# Patient Record
Sex: Male | Born: 1980 | Race: Black or African American | Hispanic: No | Marital: Single | Smoking: Former smoker
Health system: Southern US, Community
[De-identification: ages and names within clinical notes are randomized; demographics above are authoritative.]

## PROBLEM LIST (undated history)

## (undated) DIAGNOSIS — I739 Peripheral vascular disease, unspecified: Secondary | ICD-10-CM

## (undated) DIAGNOSIS — W3400XA Accidental discharge from unspecified firearms or gun, initial encounter: Secondary | ICD-10-CM

## (undated) DIAGNOSIS — I83813 Varicose veins of bilateral lower extremities with pain: Secondary | ICD-10-CM

## (undated) DIAGNOSIS — Y249XXA Unspecified firearm discharge, undetermined intent, initial encounter: Secondary | ICD-10-CM

## (undated) HISTORY — PX: VASCULAR SURGERY: SHX849

## (undated) HISTORY — DX: Varicose veins of bilateral lower extremities with pain: I83.813

## (undated) HISTORY — DX: Peripheral vascular disease, unspecified: I73.9

## (undated) HISTORY — PX: CARDIOVASCULAR SURGERY: SHX460

---
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
## 1999-04-06 ENCOUNTER — Inpatient Hospital Stay (HOSPITAL_COMMUNITY): Admission: EM | Admit: 1999-04-06 | Discharge: 1999-04-09 | Payer: Self-pay

## 1999-10-08 ENCOUNTER — Emergency Department (HOSPITAL_COMMUNITY): Admission: EM | Admit: 1999-10-08 | Discharge: 1999-10-08 | Payer: Self-pay | Admitting: Emergency Medicine

## 2000-02-22 ENCOUNTER — Emergency Department (HOSPITAL_COMMUNITY): Admission: EM | Admit: 2000-02-22 | Discharge: 2000-02-22 | Payer: Self-pay | Admitting: Emergency Medicine

## 2000-02-23 ENCOUNTER — Emergency Department (HOSPITAL_COMMUNITY): Admission: EM | Admit: 2000-02-23 | Discharge: 2000-02-23 | Payer: Self-pay | Admitting: Emergency Medicine

## 2003-11-05 ENCOUNTER — Emergency Department (HOSPITAL_COMMUNITY): Admission: EM | Admit: 2003-11-05 | Discharge: 2003-11-05 | Payer: Self-pay | Admitting: Emergency Medicine

## 2006-07-24 ENCOUNTER — Emergency Department (HOSPITAL_COMMUNITY): Admission: EM | Admit: 2006-07-24 | Discharge: 2006-07-24 | Payer: Self-pay | Admitting: Emergency Medicine

## 2010-03-19 ENCOUNTER — Emergency Department (HOSPITAL_BASED_OUTPATIENT_CLINIC_OR_DEPARTMENT_OTHER)
Admission: EM | Admit: 2010-03-19 | Discharge: 2010-03-19 | Disposition: A | Discharge status: Home / Self Care | Payer: Self-pay | Attending: Emergency Medicine | Admitting: Emergency Medicine

## 2010-03-19 ENCOUNTER — Emergency Department (INDEPENDENT_AMBULATORY_CARE_PROVIDER_SITE_OTHER): Payer: Self-pay

## 2010-03-19 DIAGNOSIS — F17200 Nicotine dependence, unspecified, uncomplicated: Secondary | ICD-10-CM | POA: Insufficient documentation

## 2010-03-19 DIAGNOSIS — R197 Diarrhea, unspecified: Secondary | ICD-10-CM | POA: Insufficient documentation

## 2010-03-19 DIAGNOSIS — R112 Nausea with vomiting, unspecified: Secondary | ICD-10-CM | POA: Insufficient documentation

## 2010-03-19 DIAGNOSIS — R109 Unspecified abdominal pain: Secondary | ICD-10-CM

## 2010-03-19 LAB — CBC
HCT: 43.6 % (ref 39.0–52.0)
Hemoglobin: 14.7 g/dL (ref 13.0–17.0)
RDW: 13.4 % (ref 11.5–15.5)
WBC: 7.9 10*3/uL (ref 4.0–10.5)

## 2010-03-19 LAB — COMPREHENSIVE METABOLIC PANEL
ALT: 57 U/L — ABNORMAL HIGH (ref 0–53)
AST: 36 U/L (ref 0–37)
Alkaline Phosphatase: 123 U/L — ABNORMAL HIGH (ref 39–117)
GFR calc Af Amer: >60 mL/min (ref >60)
Glucose, Bld: 107 mg/dL — ABNORMAL HIGH (ref 70–99)
Potassium: 4.3 mEq/L (ref 3.5–5.1)
Sodium: 146 mEq/L — ABNORMAL HIGH (ref 135–145)
Total Protein: 8 g/dL (ref 6.0–8.3)

## 2010-03-19 LAB — URINALYSIS, ROUTINE W REFLEX MICROSCOPIC
Bilirubin Urine: NEGATIVE
Glucose, UA: NEGATIVE mg/dL
Hgb urine dipstick: NEGATIVE
Ketones, ur: NEGATIVE mg/dL
Nitrite: NEGATIVE
Protein, ur: NEGATIVE mg/dL
Specific Gravity, Urine: 1.021 (ref 1.005–1.030)
Urobilinogen, UA: 0.2 mg/dL (ref 0.0–1.0)
pH: 7.5 (ref 5.0–8.0)

## 2010-03-19 LAB — DIFFERENTIAL
Basophils Absolute: 0 10*3/uL (ref 0.0–0.1)
Basophils Relative: 0 % (ref 0–1)
Eosinophils Relative: 2 % (ref 0–5)
Lymphocytes Relative: 13 % (ref 12–46)
Neutro Abs: 5.9 10*3/uL (ref 1.7–7.7)

## 2010-03-19 LAB — LIPASE, BLOOD: Lipase: 118 U/L (ref 23–300)
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
## 2010-06-21 ENCOUNTER — Emergency Department (HOSPITAL_BASED_OUTPATIENT_CLINIC_OR_DEPARTMENT_OTHER)
Admission: EM | Admit: 2010-06-21 | Discharge: 2010-06-21 | Disposition: A | Discharge status: Home / Self Care | Payer: Self-pay | Attending: Emergency Medicine | Admitting: Emergency Medicine

## 2010-06-21 DIAGNOSIS — R21 Rash and other nonspecific skin eruption: Secondary | ICD-10-CM | POA: Insufficient documentation

## 2010-10-31 LAB — DIFFERENTIAL
Lymphocytes Relative: 15
Monocytes Absolute: 0.4
Monocytes Relative: 5
Neutro Abs: 6.6
Neutrophils Relative %: 79 — ABNORMAL HIGH

## 2010-10-31 LAB — BASIC METABOLIC PANEL
Calcium: 9.8
Creatinine, Ser: 1.02
GFR calc Af Amer: >60
GFR calc non Af Amer: >60
Sodium: 140

## 2010-10-31 LAB — POCT CARDIAC MARKERS
CKMB, poc: <1 — ABNORMAL LOW
Myoglobin, poc: 54.4
Operator id: 4531

## 2010-10-31 LAB — CBC
Hemoglobin: 14.5
RBC: 4.71
WBC: 8.5

## 2010-10-31 LAB — RAPID URINE DRUG SCREEN, HOSP PERFORMED
Amphetamines: NOT DETECTED
Benzodiazepines: NOT DETECTED
Tetrahydrocannabinol: POSITIVE — AB

## 2010-12-03 ENCOUNTER — Encounter: Payer: Self-pay | Admitting: *Deleted

## 2010-12-03 ENCOUNTER — Emergency Department (HOSPITAL_COMMUNITY)
Admission: EM | Admit: 2010-12-03 | Discharge: 2010-12-03 | Disposition: A | Discharge status: Home / Self Care | Payer: Self-pay | Attending: Emergency Medicine | Admitting: Emergency Medicine

## 2010-12-03 DIAGNOSIS — J3489 Other specified disorders of nose and nasal sinuses: Secondary | ICD-10-CM | POA: Insufficient documentation

## 2010-12-03 DIAGNOSIS — F17200 Nicotine dependence, unspecified, uncomplicated: Secondary | ICD-10-CM | POA: Insufficient documentation

## 2010-12-03 DIAGNOSIS — J40 Bronchitis, not specified as acute or chronic: Secondary | ICD-10-CM | POA: Insufficient documentation

## 2010-12-03 DIAGNOSIS — R059 Cough, unspecified: Secondary | ICD-10-CM | POA: Insufficient documentation

## 2010-12-03 DIAGNOSIS — R05 Cough: Secondary | ICD-10-CM | POA: Insufficient documentation

## 2010-12-03 DIAGNOSIS — N390 Urinary tract infection, site not specified: Secondary | ICD-10-CM | POA: Insufficient documentation

## 2010-12-03 DIAGNOSIS — R6883 Chills (without fever): Secondary | ICD-10-CM | POA: Insufficient documentation

## 2010-12-03 DIAGNOSIS — Z9889 Other specified postprocedural states: Secondary | ICD-10-CM | POA: Insufficient documentation

## 2010-12-03 HISTORY — DX: Unspecified firearm discharge, undetermined intent, initial encounter: Y24.9XXA

## 2010-12-03 HISTORY — DX: Accidental discharge from unspecified firearms or gun, initial encounter: W34.00XA

## 2010-12-03 MED ORDER — AZITHROMYCIN 250 MG PO TABS: 250.0000 mg | ORAL_TABLET | Freq: Every day | ORAL | Status: AC
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
## 2010-12-03 NOTE — ED Notes (Signed)
No fever or chills, cough and cold

## 2012-05-28 ENCOUNTER — Encounter (HOSPITAL_COMMUNITY): Payer: Self-pay | Admitting: Emergency Medicine

## 2012-05-28 ENCOUNTER — Emergency Department (HOSPITAL_COMMUNITY)
Admission: EM | Admit: 2012-05-28 | Discharge: 2012-05-28 | Disposition: A | Discharge status: Home / Self Care | Payer: BC Managed Care – PPO | Attending: Emergency Medicine | Admitting: Emergency Medicine

## 2012-05-28 DIAGNOSIS — IMO0002 Reserved for concepts with insufficient information to code with codable children: Secondary | ICD-10-CM | POA: Insufficient documentation

## 2012-05-28 DIAGNOSIS — Z79899 Other long term (current) drug therapy: Secondary | ICD-10-CM | POA: Insufficient documentation

## 2012-05-28 DIAGNOSIS — Z87828 Personal history of other (healed) physical injury and trauma: Secondary | ICD-10-CM | POA: Insufficient documentation

## 2012-05-28 DIAGNOSIS — T498X5A Adverse effect of other topical agents, initial encounter: Secondary | ICD-10-CM | POA: Insufficient documentation

## 2012-05-28 DIAGNOSIS — F17200 Nicotine dependence, unspecified, uncomplicated: Secondary | ICD-10-CM | POA: Insufficient documentation

## 2012-05-28 DIAGNOSIS — T304 Corrosion of unspecified body region, unspecified degree: Secondary | ICD-10-CM

## 2012-05-28 DIAGNOSIS — N5089 Other specified disorders of the male genital organs: Secondary | ICD-10-CM | POA: Insufficient documentation

## 2012-05-28 DIAGNOSIS — B081 Molluscum contagiosum: Secondary | ICD-10-CM | POA: Insufficient documentation

## 2012-05-28 DIAGNOSIS — N509 Disorder of male genital organs, unspecified: Secondary | ICD-10-CM | POA: Insufficient documentation

## 2012-05-28 MED ORDER — BACITRACIN ZINC 500 UNIT/GM EX OINT: TOPICAL_OINTMENT | Freq: Two times a day (BID) | CUTANEOUS | Status: DC

## 2012-05-28 MED ORDER — HYDROCODONE-ACETAMINOPHEN 5-325 MG PO TABS: ORAL_TABLET | ORAL | Status: DC

## 2012-05-28 MED ORDER — CEPHALEXIN 250 MG PO CAPS: 250.0000 mg | ORAL_CAPSULE | Freq: Four times a day (QID) | ORAL | Status: DC

## 2012-05-28 NOTE — ED Notes (Signed)
Patient discharged using the teach back method he verbalizes an understanding.
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
## 2012-05-28 NOTE — ED Notes (Signed)
BIB self. Was seen by [facility] urgent care 2 weeks ago for skin rash (lower abdomen). Using prescribed cream for approx. 1 week. Now presents with red, raised rash inferior to original rash site. NO pruritis, pain. Last cream use 2 days ago. NAD

## 2012-05-29 NOTE — ED Provider Notes (Signed)
Medical screening examination/treatment/procedure(s) were performed by non-physician practitioner and as supervising physician I was immediately available for consultation/collaboration.     Travonta Gill R Elim Peale, MD 05/29/12 1540 

## 2012-11-25 ENCOUNTER — Encounter (HOSPITAL_COMMUNITY): Payer: Self-pay | Admitting: Emergency Medicine

## 2012-11-25 ENCOUNTER — Emergency Department (HOSPITAL_COMMUNITY): Payer: Self-pay

## 2012-11-25 ENCOUNTER — Emergency Department (HOSPITAL_COMMUNITY)
Admission: EM | Admit: 2012-11-25 | Discharge: 2012-11-26 | Disposition: A | Discharge status: Home / Self Care | Payer: Self-pay | Attending: Emergency Medicine | Admitting: Emergency Medicine

## 2012-11-25 DIAGNOSIS — R079 Chest pain, unspecified: Secondary | ICD-10-CM

## 2012-11-25 DIAGNOSIS — R42 Dizziness and giddiness: Secondary | ICD-10-CM | POA: Insufficient documentation

## 2012-11-25 DIAGNOSIS — R0602 Shortness of breath: Secondary | ICD-10-CM | POA: Insufficient documentation

## 2012-11-25 DIAGNOSIS — Z7982 Long term (current) use of aspirin: Secondary | ICD-10-CM | POA: Insufficient documentation

## 2012-11-25 DIAGNOSIS — R0789 Other chest pain: Secondary | ICD-10-CM | POA: Insufficient documentation

## 2012-11-25 DIAGNOSIS — F17200 Nicotine dependence, unspecified, uncomplicated: Secondary | ICD-10-CM | POA: Insufficient documentation

## 2012-11-25 LAB — BASIC METABOLIC PANEL
CO2: 27 mEq/L (ref 19–32)
Calcium: 9.4 mg/dL (ref 8.4–10.5)
Chloride: 101 mEq/L (ref 96–112)
Creatinine, Ser: 1.02 mg/dL (ref 0.50–1.35)
GFR calc Af Amer: >90 mL/min (ref >90)
Sodium: 140 mEq/L (ref 135–145)

## 2012-11-25 LAB — CBC
MCH: 31.6 pg (ref 26.0–34.0)
MCV: 93.4 fL (ref 78.0–100.0)
Platelets: 241 10*3/uL (ref 150–400)
RBC: 4.37 MIL/uL (ref 4.22–5.81)
RDW: 13.6 % (ref 11.5–15.5)
WBC: 6.8 10*3/uL (ref 4.0–10.5)

## 2012-11-25 LAB — POCT I-STAT TROPONIN I
Troponin i, poc: 0 ng/mL (ref 0.00–0.08)
Troponin i, poc: 0 ng/mL (ref 0.00–0.08)

## 2012-11-25 NOTE — ED Notes (Signed)
Patient with chest pain, shortness of breath with lightheadedness that started earlier today.  Patient denies any nausea or vomiting.  Patient states he has had this before.
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
## 2013-07-03 ENCOUNTER — Encounter (HOSPITAL_COMMUNITY): Payer: Self-pay | Admitting: Emergency Medicine

## 2013-07-03 ENCOUNTER — Emergency Department (HOSPITAL_COMMUNITY)
Admission: EM | Admit: 2013-07-03 | Discharge: 2013-07-03 | Disposition: A | Discharge status: Home / Self Care | Payer: BC Managed Care – PPO | Attending: Emergency Medicine | Admitting: Emergency Medicine

## 2013-07-03 DIAGNOSIS — Y9289 Other specified places as the place of occurrence of the external cause: Secondary | ICD-10-CM | POA: Insufficient documentation

## 2013-07-03 DIAGNOSIS — X58XXXA Exposure to other specified factors, initial encounter: Secondary | ICD-10-CM

## 2013-07-03 DIAGNOSIS — Z791 Long term (current) use of non-steroidal anti-inflammatories (NSAID): Secondary | ICD-10-CM | POA: Insufficient documentation

## 2013-07-03 DIAGNOSIS — S335XXA Sprain of ligaments of lumbar spine, initial encounter: Secondary | ICD-10-CM | POA: Insufficient documentation

## 2013-07-03 DIAGNOSIS — Y9389 Activity, other specified: Secondary | ICD-10-CM | POA: Insufficient documentation

## 2013-07-03 DIAGNOSIS — F17200 Nicotine dependence, unspecified, uncomplicated: Secondary | ICD-10-CM | POA: Insufficient documentation

## 2013-07-03 DIAGNOSIS — R51 Headache: Secondary | ICD-10-CM | POA: Insufficient documentation

## 2013-07-03 DIAGNOSIS — Z87828 Personal history of other (healed) physical injury and trauma: Secondary | ICD-10-CM | POA: Insufficient documentation

## 2013-07-03 DIAGNOSIS — S39012A Strain of muscle, fascia and tendon of lower back, initial encounter: Secondary | ICD-10-CM

## 2013-07-03 DIAGNOSIS — X503XXA Overexertion from repetitive movements, initial encounter: Secondary | ICD-10-CM | POA: Insufficient documentation

## 2013-07-03 DIAGNOSIS — L723 Sebaceous cyst: Secondary | ICD-10-CM | POA: Insufficient documentation

## 2013-07-03 DIAGNOSIS — L729 Follicular cyst of the skin and subcutaneous tissue, unspecified: Secondary | ICD-10-CM

## 2013-07-03 DIAGNOSIS — Z7982 Long term (current) use of aspirin: Secondary | ICD-10-CM | POA: Insufficient documentation

## 2013-07-03 MED ORDER — MELOXICAM 15 MG PO TABS: 15.0000 mg | ORAL_TABLET | Freq: Every day | ORAL | Status: DC

## 2013-07-03 NOTE — ED Notes (Signed)
Refused Wheelchair 

## 2013-07-03 NOTE — ED Notes (Signed)
Pt states he has had lower back pain x 1 week works liftting heavy things at work also has a bump on his head and that he is now losing hair on that spot
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
## 2013-07-03 NOTE — Discharge Instructions (Signed)
Take meloxicam once daily as prescribed. Rest, avoid heavy lifting or hard physical activity. Apply an ice pack intermittently, 15 minutes at a time followed by heating pad.  Epidermal Cyst An epidermal cyst is sometimes called a sebaceous cyst, epidermal inclusion cyst, or infundibular cyst. These cysts usually contain a substance that looks "pasty" or "cheesy" and may have a bad smell. This substance is a protein called keratin. Epidermal cysts are usually found on the face, neck, or trunk. They may also occur in the vaginal area or other parts of the genitalia of both men and women. Epidermal cysts are usually small, painless, slow-growing bumps or lumps that move freely under the skin. It is important not to try to pop them. This may cause an infection and lead to tenderness and swelling. CAUSES  Epidermal cysts may be caused by a deep penetrating injury to the skin or a plugged hair follicle, often associated with acne. SYMPTOMS  Epidermal cysts can become inflamed and cause:  Redness.  Tenderness.  Increased temperature of the skin over the bumps or lumps.  Grayish-white, bad smelling material that drains from the bump or lump. DIAGNOSIS  Epidermal cysts are easily diagnosed by your caregiver during an exam. Rarely, a tissue sample (biopsy) may be taken to rule out other conditions that may resemble epidermal cysts. TREATMENT   Epidermal cysts often get better and disappear on their own. They are rarely ever cancerous.  If a cyst becomes infected, it may become inflamed and tender. This may require opening and draining the cyst. Treatment with antibiotics may be necessary. When the infection is gone, the cyst may be removed with minor surgery.  Small, inflamed cysts can often be treated with antibiotics or by injecting steroid medicines.  Sometimes, epidermal cysts become large and bothersome. If this happens, surgical removal in your caregiver's office may be necessary. HOME CARE  INSTRUCTIONS  Only take over-the-counter or prescription medicines as directed by your caregiver.  Take your antibiotics as directed. Finish them even if you start to feel better. SEEK MEDICAL CARE IF:   Your cyst becomes tender, red, or swollen.  Your condition is not improving or is getting worse.  You have any other questions or concerns. MAKE SURE YOU:  Understand these instructions.  Will watch your condition.  Will get help right away if you are not doing well or get worse. Document Released: 12/03/2003 Document Revised: 03/26/2011 Document Reviewed: 07/10/2010 [facility] Patient Information 2015 [place], [place]. This information is not intended to replace advice given to you by your health care provider. Make sure you discuss any questions you have with your health care provider.  Lumbosacral Strain Lumbosacral strain is a strain of any of the parts that make up your lumbosacral vertebrae. Your lumbosacral vertebrae are the bones that make up the lower third of your backbone. Your lumbosacral vertebrae are held together by muscles and tough, fibrous tissue (ligaments).  CAUSES  A sudden blow to your back can cause lumbosacral strain. Also, anything that causes an excessive stretch of the muscles in the low back can cause this strain. This is typically seen when people exert themselves strenuously, fall, lift heavy objects, bend, or crouch repeatedly. RISK FACTORS  Physically demanding work.  Participation in pushing or pulling sports or sports that require a sudden twist of the back (tennis, golf, baseball).  Weight lifting.  Excessive lower back curvature.  Forward-tilted pelvis.  Weak back or abdominal muscles or both.  Tight hamstrings. SIGNS AND SYMPTOMS  Lumbosacral  strain may cause pain in the area of your injury or pain that moves (radiates) down your leg.  DIAGNOSIS Your health care provider can often diagnose lumbosacral strain through a physical exam. In  some cases, you may need tests such as X-ray exams.  TREATMENT  Treatment for your lower back injury depends on many factors that your clinician will have to evaluate. However, most treatment will include the use of anti-inflammatory medicines. HOME CARE INSTRUCTIONS   Avoid hard physical activities (tennis, racquetball, waterskiing) if you are not in proper physical condition for it. This may aggravate or create problems.  If you have a back problem, avoid sports requiring sudden body movements. Swimming and walking are generally safer activities.  Maintain good posture.  Maintain a healthy weight.  For acute conditions, you may put ice on the injured area.  Put ice in a plastic bag.  Place a towel between your skin and the bag.  Leave the ice on for 20 minutes, 2-3 times a day.  When the low back starts healing, stretching and strengthening exercises may be recommended. SEEK MEDICAL CARE IF:  Your back pain is getting worse.  You experience severe back pain not relieved with medicines. SEEK IMMEDIATE MEDICAL CARE IF:   You have numbness, tingling, weakness, or problems with the use of your arms or legs.  There is a change in bowel or bladder control.  You have increasing pain in any area of the body, including your belly (abdomen).  You notice shortness of breath, dizziness, or feel faint.  You feel sick to your stomach (nauseous), are throwing up (vomiting), or become sweaty.  You notice discoloration of your toes or legs, or your feet get very cold. MAKE SURE YOU:   Understand these instructions.  Will watch your condition.  Will get help right away if you are not doing well or get worse. Document Released: 10/11/2004 Document Revised: 01/06/2013 Document Reviewed: 08/20/2012 [facility] Patient Information 2015 [place], [place]. This information is not intended to replace advice given to you by your health care provider. Make sure you discuss any questions you have  with your health care provider.  Low Back Strain with Rehab A strain is an injury in which a tendon or muscle is torn. The muscles and tendons of the lower back are vulnerable to strains. However, these muscles and tendons are very strong and require a great force to be injured. Strains are classified into three categories. Grade 1 strains cause pain, but the tendon is not lengthened. Grade 2 strains include a lengthened ligament, due to the ligament being stretched or partially ruptured. With grade 2 strains there is still function, although the function may be decreased. Grade 3 strains involve a complete tear of the tendon or muscle, and function is usually impaired. SYMPTOMS   Pain in the lower back.  Pain that affects one side more than the other.  Pain that gets worse with movement and may be felt in the hip, buttocks, or back of the thigh.  Muscle spasms of the muscles in the back.  Swelling along the muscles of the back.  Loss of strength of the back muscles.  Crackling sound (crepitation) when the muscles are touched. CAUSES  Lower back strains occur when a force is placed on the muscles or tendons that is greater than they can handle. Common causes of injury include:  Prolonged overuse of the muscle-tendon units in the lower back, usually from incorrect posture.  A single violent injury or  force applied to the back. RISK INCREASES WITH:  Sports that involve twisting forces on the spine or a lot of bending at the waist (football, rugby, weightlifting, bowling, golf, tennis, speed skating, racquetball, swimming, running, gymnastics, diving).  Poor strength and flexibility.  Failure to warm up properly before activity.  Family history of lower back pain or disk disorders.  Previous back injury or surgery (especially fusion).  Poor posture with lifting, especially heavy objects.  Prolonged sitting, especially with poor posture. PREVENTION   Learn and use proper  posture when sitting or lifting (maintain proper posture when sitting, lift using the knees and legs, not at the waist).  Warm up and stretch properly before activity.  Allow for adequate recovery between workouts.  Maintain physical fitness:  Strength, flexibility, and endurance.  Cardiovascular fitness. PROGNOSIS  If treated properly, lower back strains usually heal within 6 weeks. RELATED COMPLICATIONS   Recurring symptoms, resulting in a chronic problem.  Chronic inflammation, scarring, and partial muscle-tendon tear.  Delayed healing or resolution of symptoms.  Prolonged disability. TREATMENT  Treatment first involves the use of ice and medicine, to reduce pain and inflammation. The use of strengthening and stretching exercises may help reduce pain with activity. These exercises may be performed at home or with a therapist. Severe injuries may require referral to a therapist for further evaluation and treatment, such as ultrasound. Your caregiver may advise that you wear a back brace or corset, to help reduce pain and discomfort. Often, prolonged bed rest results in greater harm then benefit. Corticosteroid injections may be recommended. However, these should be reserved for the most serious cases. It is important to avoid using your back when lifting objects. At night, sleep on your back on a firm mattress with a pillow placed under your knees. If non-surgical treatment is unsuccessful, surgery may be needed.  MEDICATION   If pain medicine is needed, nonsteroidal anti-inflammatory medicines (aspirin and ibuprofen), or other minor pain relievers (acetaminophen), are often advised.  Do not take pain medicine for 7 days before surgery.  Prescription pain relievers may be given, if your caregiver thinks they are needed. Use only as directed and only as much as you need.  Ointments applied to the skin may be helpful.  Corticosteroid injections may be given by your caregiver. These  injections should be reserved for the most serious cases, because they may only be given a certain number of times. HEAT AND COLD  Cold treatment (icing) should be applied for 10 to 15 minutes every 2 to 3 hours for inflammation and pain, and immediately after activity that aggravates your symptoms. Use ice packs or an ice massage.  Heat treatment may be used before performing stretching and strengthening activities prescribed by your caregiver, physical therapist, or athletic trainer. Use a heat pack or a warm water soak. SEEK MEDICAL CARE IF:   Symptoms get worse or do not improve in 2 to 4 weeks, despite treatment.  You develop numbness, weakness, or loss of bowel or bladder function.  New, unexplained symptoms develop. (Drugs used in treatment may produce side effects.) EXERCISES  RANGE OF MOTION (ROM) AND STRETCHING EXERCISES - Low Back Strain Most people with lower back pain will find that their symptoms get worse with excessive bending forward (flexion) or arching at the lower back (extension). The exercises which will help resolve your symptoms will focus on the opposite motion.  Your physician, physical therapist or athletic trainer will help you determine which exercises will be most  helpful to resolve your lower back pain. Do not complete any exercises without first consulting with your caregiver. Discontinue any exercises which make your symptoms worse until you speak to your caregiver.  If you have pain, numbness or tingling which travels down into your buttocks, leg or foot, the goal of the therapy is for these symptoms to move closer to your back and eventually resolve. Sometimes, these leg symptoms will get better, but your lower back pain may worsen. This is typically an indication of progress in your rehabilitation. Be very alert to any changes in your symptoms and the activities in which you participated in the 24 hours prior to the change. Sharing this information with your  caregiver will allow him/her to most efficiently treat your condition.  These exercises may help you when beginning to rehabilitate your injury. Your symptoms may resolve with or without further involvement from your physician, physical therapist or athletic trainer. While completing these exercises, remember:  Restoring tissue flexibility helps normal motion to return to the joints. This allows healthier, less painful movement and activity.  An effective stretch should be held for at least 30 seconds.  A stretch should never be painful. You should only feel a gentle lengthening or release in the stretched tissue. FLEXION RANGE OF MOTION AND STRETCHING EXERCISES: STRETCH - Flexion, Single Knee to Chest   Lie on a firm bed or floor with both legs extended in front of you.  Keeping one leg in contact with the floor, bring your opposite knee to your chest. Hold your leg in place by either grabbing behind your thigh or at your knee.  Pull until you feel a gentle stretch in your lower back. Hold __________ seconds.  Slowly release your grasp and repeat the exercise with the opposite side. Repeat __________ times. Complete this exercise __________ times per day.  STRETCH - Flexion, Double Knee to Chest   Lie on a firm bed or floor with both legs extended in front of you.  Keeping one leg in contact with the floor, bring your opposite knee to your chest.  Tense your stomach muscles to support your back and then lift your other knee to your chest. Hold your legs in place by either grabbing behind your thighs or at your knees.  Pull both knees toward your chest until you feel a gentle stretch in your lower back. Hold __________ seconds.  Tense your stomach muscles and slowly return one leg at a time to the floor. Repeat __________ times. Complete this exercise __________ times per day.  STRETCH - Low Trunk Rotation  Lie on a firm bed or floor. Keeping your legs in front of you, bend your  knees so they are both pointed toward the ceiling and your feet are flat on the floor.  Extend your arms out to the side. This will stabilize your upper body by keeping your shoulders in contact with the floor.  Gently and slowly drop both knees together to one side until you feel a gentle stretch in your lower back. Hold for __________ seconds.  Tense your stomach muscles to support your lower back as you bring your knees back to the starting position. Repeat the exercise to the other side. Repeat __________ times. Complete this exercise __________ times per day  EXTENSION RANGE OF MOTION AND FLEXIBILITY EXERCISES: STRETCH - Extension, Prone on Elbows   Lie on your stomach on the floor, a bed will be too soft. Place your palms about shoulder width apart and  at the height of your head.  Place your elbows under your shoulders. If this is too painful, stack pillows under your chest.  Allow your body to relax so that your hips drop lower and make contact more completely with the floor.  Hold this position for __________ seconds.  Slowly return to lying flat on the floor. Repeat __________ times. Complete this exercise __________ times per day.  RANGE OF MOTION - Extension, Prone Press Ups  Lie on your stomach on the floor, a bed will be too soft. Place your palms about shoulder width apart and at the height of your head.  Keeping your back as relaxed as possible, slowly straighten your elbows while keeping your hips on the floor. You may adjust the placement of your hands to maximize your comfort. As you gain motion, your hands will come more underneath your shoulders.  Hold this position __________ seconds.  Slowly return to lying flat on the floor. Repeat __________ times. Complete this exercise __________ times per day.  RANGE OF MOTION- Quadruped, Neutral Spine   Assume a hands and knees position on a firm surface. Keep your hands under your shoulders and your knees under your hips.  You may place padding under your knees for comfort.  Drop your head and point your tail bone toward the ground below you. This will round out your lower back like an angry cat. Hold this position for __________ seconds.  Slowly lift your head and release your tail bone so that your back sags into a large arch, like an old horse.  Hold this position for __________ seconds.  Repeat this until you feel limber in your lower back.  Now, find your "sweet spot." This will be the most comfortable position somewhere between the two previous positions. This is your neutral spine. Once you have found this position, tense your stomach muscles to support your lower back.  Hold this position for __________ seconds. Repeat __________ times. Complete this exercise __________ times per day.  STRENGTHENING EXERCISES - Low Back Strain These exercises may help you when beginning to rehabilitate your injury. These exercises should be done near your "sweet spot." This is the neutral, low-back arch, somewhere between fully rounded and fully arched, that is your least painful position. When performed in this safe range of motion, these exercises can be used for people who have either a flexion or extension based injury. These exercises may resolve your symptoms with or without further involvement from your physician, physical therapist or athletic trainer. While completing these exercises, remember:   Muscles can gain both the endurance and the strength needed for everyday activities through controlled exercises.  Complete these exercises as instructed by your physician, physical therapist or athletic trainer. Increase the resistance and repetitions only as guided.  You may experience muscle soreness or fatigue, but the pain or discomfort you are trying to eliminate should never worsen during these exercises. If this pain does worsen, stop and make certain you are following the directions exactly. If the pain is still  present after adjustments, discontinue the exercise until you can discuss the trouble with your caregiver. STRENGTHENING - Deep Abdominals, Pelvic Tilt  Lie on a firm bed or floor. Keeping your legs in front of you, bend your knees so they are both pointed toward the ceiling and your feet are flat on the floor.  Tense your lower abdominal muscles to press your lower back into the floor. This motion will rotate your pelvis so that your  tail bone is scooping upwards rather than pointing at your feet or into the floor.  With a gentle tension and even breathing, hold this position for __________ seconds. Repeat __________ times. Complete this exercise __________ times per day.  STRENGTHENING - Abdominals, Crunches   Lie on a firm bed or floor. Keeping your legs in front of you, bend your knees so they are both pointed toward the ceiling and your feet are flat on the floor. Cross your arms over your chest.  Slightly tip your chin down without bending your neck.  Tense your abdominals and slowly lift your trunk high enough to just clear your shoulder blades. Lifting higher can put excessive stress on the lower back and does not further strengthen your abdominal muscles.  Control your return to the starting position. Repeat __________ times. Complete this exercise __________ times per day.  STRENGTHENING - Quadruped, Opposite UE/LE Lift   Assume a hands and knees position on a firm surface. Keep your hands under your shoulders and your knees under your hips. You may place padding under your knees for comfort.  Find your neutral spine and gently tense your abdominal muscles so that you can maintain this position. Your shoulders and hips should form a rectangle that is parallel with the floor and is not twisted.  Keeping your trunk steady, lift your right hand no higher than your shoulder and then your left leg no higher than your hip. Make sure you are not holding your breath. Hold this position  __________ seconds.  Continuing to keep your abdominal muscles tense and your back steady, slowly return to your starting position. Repeat with the opposite arm and leg. Repeat __________ times. Complete this exercise __________ times per day.  STRENGTHENING - Lower Abdominals, Double Knee Lift  Lie on a firm bed or floor. Keeping your legs in front of you, bend your knees so they are both pointed toward the ceiling and your feet are flat on the floor.  Tense your abdominal muscles to brace your lower back and slowly lift both of your knees until they come over your hips. Be certain not to hold your breath.  Hold __________ seconds. Using your abdominal muscles, return to the starting position in a slow and controlled manner. Repeat __________ times. Complete this exercise __________ times per day.  POSTURE AND BODY MECHANICS CONSIDERATIONS - Low Back Strain Keeping correct posture when sitting, standing or completing your activities will reduce the stress put on different body tissues, allowing injured tissues a chance to heal and limiting painful experiences. The following are general guidelines for improved posture. Your physician or physical therapist will provide you with any instructions specific to your needs. While reading these guidelines, remember:  The exercises prescribed by your provider will help you have the flexibility and strength to maintain correct postures.  The correct posture provides the best environment for your joints to work. All of your joints have less wear and tear when properly supported by a spine with good posture. This means you will experience a healthier, less painful body.  Correct posture must be practiced with all of your activities, especially prolonged sitting and standing. Correct posture is as important when doing repetitive low-stress activities (typing) as it is when doing a single heavy-load activity (lifting). RESTING POSITIONS Consider which  positions are most painful for you when choosing a resting position. If you have pain with flexion-based activities (sitting, bending, stooping, squatting), choose a position that allows you to rest in a  less flexed posture. You would want to avoid curling into a fetal position on your side. If your pain worsens with extension-based activities (prolonged standing, working overhead), avoid resting in an extended position such as sleeping on your stomach. Most people will find more comfort when they rest with their spine in a more neutral position, neither too rounded nor too arched. Lying on a non-sagging bed on your side with a pillow between your knees, or on your back with a pillow under your knees will often provide some relief. Keep in mind, being in any one position for a prolonged period of time, no matter how correct your posture, can still lead to stiffness. PROPER SITTING POSTURE In order to minimize stress and discomfort on your spine, you must sit with correct posture. Sitting with good posture should be effortless for a healthy body. Returning to good posture is a gradual process. Many people can work toward this most comfortably by using various supports until they have the flexibility and strength to maintain this posture on their own. When sitting with proper posture, your ears will fall over your shoulders and your shoulders will fall over your hips. You should use the back of the chair to support your upper back. Your lower back will be in a neutral position, just slightly arched. You may place a small pillow or folded towel at the base of your lower back for support.  When working at a desk, create an environment that supports good, upright posture. Without extra support, muscles tire, which leads to excessive strain on joints and other tissues. Keep these recommendations in mind: CHAIR:  A chair should be able to slide under your desk when your back makes contact with the back of the chair.  This allows you to work closely.  The chair's height should allow your eyes to be level with the upper part of your monitor and your hands to be slightly lower than your elbows. BODY POSITION  Your feet should make contact with the floor. If this is not possible, use a foot rest.  Keep your ears over your shoulders. This will reduce stress on your neck and lower back. INCORRECT SITTING POSTURES  If you are feeling tired and unable to assume a healthy sitting posture, do not slouch or slump. This puts excessive strain on your back tissues, causing more damage and pain. Healthier options include:  Using more support, like a lumbar pillow.  Switching tasks to something that requires you to be upright or walking.  Talking a brief walk.  Lying down to rest in a neutral-spine position. PROLONGED STANDING WHILE SLIGHTLY LEANING FORWARD  When completing a task that requires you to lean forward while standing in one place for a long time, place either foot up on a stationary 2-4 inch high object to help maintain the best posture. When both feet are on the ground, the lower back tends to lose its slight inward curve. If this curve flattens (or becomes too large), then the back and your other joints will experience too much stress, tire more quickly, and can cause pain. CORRECT STANDING POSTURES Proper standing posture should be assumed with all daily activities, even if they only take a few moments, like when brushing your teeth. As in sitting, your ears should fall over your shoulders and your shoulders should fall over your hips. You should keep a slight tension in your abdominal muscles to brace your spine. Your tailbone should point down to the ground, not behind  your body, resulting in an over-extended swayback posture.  INCORRECT STANDING POSTURES  Common incorrect standing postures include a forward head, locked knees and/or an excessive swayback. WALKING Walk with an upright posture. Your  ears, shoulders and hips should all line-up. PROLONGED ACTIVITY IN A FLEXED POSITION When completing a task that requires you to bend forward at your waist or lean over a low surface, try to find a way to stabilize 3 out of 4 of your limbs. You can place a hand or elbow on your thigh or rest a knee on the surface you are reaching across. This will provide you more stability so that your muscles do not fatigue as quickly. By keeping your knees relaxed, or slightly bent, you will also reduce stress across your lower back. CORRECT LIFTING TECHNIQUES DO :   Assume a wide stance. This will provide you more stability and the opportunity to get as close as possible to the object which you are lifting.  Tense your abdominals to brace your spine. Bend at the knees and hips. Keeping your back locked in a neutral-spine position, lift using your leg muscles. Lift with your legs, keeping your back straight.  Test the weight of unknown objects before attempting to lift them.  Try to keep your elbows locked down at your sides in order get the best strength from your shoulders when carrying an object.  Always ask for help when lifting heavy or awkward objects. INCORRECT LIFTING TECHNIQUES DO NOT:   Lock your knees when lifting, even if it is a small object.  Bend and twist. Pivot at your feet or move your feet when needing to change directions.  Assume that you can safely pick up even a paper clip without proper posture. Document Released: 01/01/2005 Document Revised: 03/26/2011 Document Reviewed: 04/15/2008 [facility] Patient Information 2015 [place], [place]. This information is not intended to replace advice given to you by your health care provider. Make sure you discuss any questions you have with your health care provider.

## 2013-07-08 NOTE — ED Provider Notes (Signed)
Medical screening examination/treatment/procedure(s) were performed by non-physician practitioner and as supervising physician I was immediately available for consultation/collaboration.   EKG Interpretation None        Mark James, MD 07/08/13 0840 

## 2013-10-13 ENCOUNTER — Encounter (HOSPITAL_COMMUNITY): Payer: Self-pay | Admitting: Emergency Medicine

## 2013-10-13 ENCOUNTER — Emergency Department (HOSPITAL_COMMUNITY)
Admission: EM | Admit: 2013-10-13 | Discharge: 2013-10-13 | Disposition: A | Discharge status: Home / Self Care | Payer: BC Managed Care – PPO | Attending: Emergency Medicine | Admitting: Emergency Medicine

## 2013-10-13 DIAGNOSIS — Z791 Long term (current) use of non-steroidal anti-inflammatories (NSAID): Secondary | ICD-10-CM | POA: Insufficient documentation

## 2013-10-13 DIAGNOSIS — J3489 Other specified disorders of nose and nasal sinuses: Secondary | ICD-10-CM | POA: Insufficient documentation

## 2013-10-13 DIAGNOSIS — Z87891 Personal history of nicotine dependence: Secondary | ICD-10-CM | POA: Insufficient documentation

## 2013-10-13 DIAGNOSIS — Z9889 Other specified postprocedural states: Secondary | ICD-10-CM | POA: Insufficient documentation

## 2013-10-13 DIAGNOSIS — J069 Acute upper respiratory infection, unspecified: Secondary | ICD-10-CM | POA: Insufficient documentation

## 2013-10-13 DIAGNOSIS — Z87828 Personal history of other (healed) physical injury and trauma: Secondary | ICD-10-CM | POA: Insufficient documentation

## 2013-10-13 DIAGNOSIS — Z7982 Long term (current) use of aspirin: Secondary | ICD-10-CM | POA: Insufficient documentation
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
## 2013-10-13 NOTE — Discharge Instructions (Signed)
Upper Respiratory Infection, Adult °An upper respiratory infection (URI) is also sometimes known as the common cold. The upper respiratory tract includes the nose, sinuses, throat, trachea, and bronchi. Bronchi are the airways leading to the lungs. Most people improve within 1 week, but symptoms can last up to 2 weeks. A residual cough may last even longer.  °CAUSES °Many different viruses can infect the tissues lining the upper respiratory tract. The tissues become irritated and inflamed and often become very moist. Mucus production is also common. A cold is contagious. You can easily spread the virus to others by oral contact. This includes kissing, sharing a glass, coughing, or sneezing. Touching your mouth or nose and then touching a surface, which is then touched by another person, can also spread the virus. °SYMPTOMS  °Symptoms typically develop 1 to 3 days after you come in contact with a cold virus. Symptoms vary from person to person. They may include: °· Runny nose. °· Sneezing. °· Nasal congestion. °· Sinus irritation. °· Sore throat. °· Loss of voice (laryngitis). °· Cough. °· Fatigue. °· Muscle aches. °· Loss of appetite. °· Headache. °· Low-grade fever. °DIAGNOSIS  °You might diagnose your own cold based on familiar symptoms, since most people get a cold 2 to 3 times a year. Your caregiver can confirm this based on your exam. Most importantly, your caregiver can check that your symptoms are not due to another disease such as strep throat, sinusitis, pneumonia, asthma, or epiglottitis. Blood tests, throat tests, and X-rays are not necessary to diagnose a common cold, but they may sometimes be helpful in excluding other more serious diseases. Your caregiver will decide if any further tests are required. °RISKS AND COMPLICATIONS  °You may be at risk for a more severe case of the common cold if you smoke cigarettes, have chronic heart disease (such as heart failure) or lung disease (such as asthma), or if  you have a weakened immune system. The very young and very old are also at risk for more serious infections. Bacterial sinusitis, middle ear infections, and bacterial pneumonia can complicate the common cold. The common cold can worsen asthma and chronic obstructive pulmonary disease (COPD). Sometimes, these complications can require emergency medical care and may be life-threatening. °PREVENTION  °The best way to protect against getting a cold is to practice good hygiene. Avoid oral or hand contact with people with cold symptoms. Wash your hands often if contact occurs. There is no clear evidence that vitamin C, vitamin E, echinacea, or exercise reduces the chance of developing a cold. However, it is always recommended to get plenty of rest and practice good nutrition. °TREATMENT  °Treatment is directed at relieving symptoms. There is no cure. Antibiotics are not effective, because the infection is caused by a virus, not by bacteria. Treatment may include: °· Increased fluid intake. Sports drinks offer valuable electrolytes, sugars, and fluids. °· Breathing heated mist or steam (vaporizer or shower). °· Eating chicken soup or other clear broths, and maintaining good nutrition. °· Getting plenty of rest. °· Using gargles or lozenges for comfort. °· Controlling fevers with ibuprofen or acetaminophen as directed by your caregiver. °· Increasing usage of your inhaler if you have asthma. °Zinc gel and zinc lozenges, taken in the first 24 hours of the common cold, can shorten the duration and lessen the severity of symptoms. Pain medicines may help with fever, muscle aches, and throat pain. A variety of non-prescription medicines are available to treat congestion and runny nose. Your caregiver   can make recommendations and may suggest nasal or lung inhalers for other symptoms.  HOME CARE INSTRUCTIONS   Only take over-the-counter or prescription medicines for pain, discomfort, or fever as directed by your  caregiver.  Use a warm mist humidifier or inhale steam from a shower to increase air moisture. This may keep secretions moist and make it easier to breathe.  Drink enough water and fluids to keep your urine clear or pale yellow.  Rest as needed.  Return to work when your temperature has returned to normal or as your caregiver advises. You may need to stay home longer to avoid infecting others. You can also use a face mask and careful hand washing to prevent spread of the virus. SEEK MEDICAL CARE IF:   After the first few days, you feel you are getting worse rather than better.  You need your caregiver's advice about medicines to control symptoms.  You develop chills, worsening shortness of breath, or brown or red sputum. These may be signs of pneumonia.  You develop yellow or brown nasal discharge or pain in the face, especially when you bend forward. These may be signs of sinusitis.  You develop a fever, swollen neck glands, pain with swallowing, or white areas in the back of your throat. These may be signs of strep throat. SEEK IMMEDIATE MEDICAL CARE IF:   You have a fever.  You develop severe or persistent headache, ear pain, sinus pain, or chest pain.  You develop wheezing, a prolonged cough, cough up blood, or have a change in your usual mucus (if you have chronic lung disease).  You develop sore muscles or a stiff neck. Document Released: 06/27/2000 Document Revised: 03/26/2011 Document Reviewed: 04/08/2013 [facility] Patient Information 2015 [place], [place]. This information is not intended to replace advice given to you by your health care provider. Make sure you discuss any questions you have with your health care provider. You are taking all the correct measures  is for your cold symptoms, you can also add steam inhalation.  He can take a small amount of the excluded in a positive steaming water.  Place a towel of your head and neck and grieving the scene.  For several minutes.   This will cause her nose to run copiously, but help open.  The sinuses

## 2013-10-13 NOTE — ED Notes (Signed)
Patient states that he has sinus congestion with yellow mucus. Patient states prior to that he had sneezed multiple times. Patient denies any fever or chills.

## 2013-10-14 NOTE — ED Provider Notes (Signed)
Medical screening examination/treatment/procedure(s) were performed by non-physician practitioner and as supervising physician I was immediately available for consultation/collaboration.  Megan Docherty, MD 10/14/13 1421 

## 2014-07-16 ENCOUNTER — Encounter (HOSPITAL_COMMUNITY): Payer: Self-pay | Admitting: Emergency Medicine

## 2014-07-16 ENCOUNTER — Emergency Department (HOSPITAL_COMMUNITY)
Admission: EM | Admit: 2014-07-16 | Discharge: 2014-07-16 | Disposition: A | Discharge status: Home / Self Care | Payer: Self-pay | Attending: Emergency Medicine | Admitting: Emergency Medicine

## 2014-07-16 DIAGNOSIS — Z7901 Long term (current) use of anticoagulants: Secondary | ICD-10-CM | POA: Insufficient documentation

## 2014-07-16 DIAGNOSIS — Z87891 Personal history of nicotine dependence: Secondary | ICD-10-CM | POA: Insufficient documentation

## 2014-07-16 DIAGNOSIS — Z87828 Personal history of other (healed) physical injury and trauma: Secondary | ICD-10-CM | POA: Insufficient documentation

## 2014-07-16 DIAGNOSIS — G629 Polyneuropathy, unspecified: Secondary | ICD-10-CM | POA: Insufficient documentation

## 2014-07-16 DIAGNOSIS — Z791 Long term (current) use of non-steroidal anti-inflammatories (NSAID): Secondary | ICD-10-CM | POA: Insufficient documentation

## 2014-07-16 LAB — I-STAT CHEM 8, ED
BUN: 11 mg/dL (ref 6–20)
CALCIUM ION: 1.22 mmol/L (ref 1.12–1.23)
CHLORIDE: 103 mmol/L (ref 101–111)
Creatinine, Ser: 0.9 mg/dL (ref 0.61–1.24)
GLUCOSE: 104 mg/dL — AB (ref 65–99)
HCT: 43 % (ref 39.0–52.0)
Hemoglobin: 14.6 g/dL (ref 13.0–17.0)
Potassium: 3.6 mmol/L (ref 3.5–5.1)
SODIUM: 140 mmol/L (ref 135–145)
TCO2: 26 mmol/L (ref 0–100)

## 2014-07-16 MED ORDER — MELOXICAM 15 MG PO TABS: 15.0000 mg | ORAL_TABLET | Freq: Every day | ORAL | Status: DC

## 2014-07-16 MED ORDER — GABAPENTIN 300 MG PO CAPS: 300.0000 mg | ORAL_CAPSULE | Freq: Three times a day (TID) | ORAL | Status: DC

## 2014-07-16 MED ORDER — MELOXICAM 15 MG PO TABS
15.0000 mg | ORAL_TABLET | Freq: Once | ORAL | Status: AC
  Administered 2014-07-16: 15 mg via ORAL
  Filled 2014-07-16: qty 1

## 2014-07-16 MED ORDER — GABAPENTIN 300 MG PO CAPS
300.0000 mg | ORAL_CAPSULE | Freq: Once | ORAL | Status: AC
  Administered 2014-07-16: 300 mg via ORAL
  Filled 2014-07-16: qty 1
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
## 2014-07-16 NOTE — Discharge Instructions (Signed)
Neuropathic Pain We often think that pain has a physical cause. If we get rid of the cause, the pain should go away. Nerves themselves can also cause pain. It is called neuropathic pain, which means nerve abnormality. It may be difficult for the patients who have it and for the treating caregivers. Pain is usually described as acute (short-lived) or chronic (long-lasting). Acute pain is related to the physical sensations caused by an injury. It can last from a few seconds to many weeks, but it usually goes away when normal healing occurs. Chronic pain lasts beyond the typical healing time. With neuropathic pain, the nerve fibers themselves may be damaged or injured. They then send incorrect signals to other pain centers. The pain you feel is real, but the cause is not easy to find.  CAUSES  Chronic pain can result from diseases, such as diabetes and shingles (an infection related to chickenpox), or from trauma, surgery, or amputation. It can also happen without any known injury or disease. The nerves are sending pain messages, even though there is no identifiable cause for such messages.   Other common causes of neuropathy include diabetes, phantom limb pain, or Regional Pain Syndrome (RPS).  As with all forms of chronic back pain, if neuropathy is not correctly treated, there can be a number of associated problems that lead to a downward cycle for the patient. These include depression, sleeplessness, feelings of fear and anxiety, limited social interaction and inability to do normal daily activities or work.  The most dramatic and mysterious example of neuropathic pain is called "phantom limb syndrome." This occurs when an arm or a leg has been removed because of illness or injury. The brain still gets pain messages from the nerves that originally carried impulses from the missing limb. These nerves now seem to misfire and cause troubling pain.  Neuropathic pain often seems to have no cause. It responds  poorly to standard pain treatment. Neuropathic pain can occur after:  Shingles (herpes zoster virus infection).  A lasting burning sensation of the skin, caused usually by injury to a peripheral nerve.  Peripheral neuropathy which is widespread nerve damage, often caused by diabetes or alcoholism.  Phantom limb pain following an amputation.  Facial nerve problems (trigeminal neuralgia).  Multiple sclerosis.  Reflex sympathetic dystrophy.  Pain which comes with cancer and cancer chemotherapy.  Entrapment neuropathy such as when pressure is put on a nerve such as in carpal tunnel syndrome.  Back, leg, and hip problems (sciatica).  Spine or back surgery.  HIV Infection or AIDS where nerves are infected by viruses. Your caregiver can explain items in the above list which may apply to you. SYMPTOMS  Characteristics of neuropathic pain are:  Severe, sharp, electric shock-like, shooting, lightening-like, knife-like.  Pins and needles sensation.  Deep burning, deep cold, or deep ache.  Persistent numbness, tingling, or weakness.  Pain resulting from light touch or other stimulus that would not usually cause pain.  Increased sensitivity to something that would normally cause pain, such as a pinprick. Pain may persist for months or years following the healing of damaged tissues. When this happens, pain signals no longer sound an alarm about current injuries or injuries about to happen. Instead, the alarm system itself is not working correctly.  Neuropathic pain may get worse instead of better over time. For some people, it can lead to serious disability. It is important to be aware that severe injury in a limb can occur without a proper, protective pain  response. Burns, cuts, and other injuries may go unnoticed. Without proper treatment, these injuries can become infected or lead to further disability. Take any injury seriously, and consult your caregiver for treatment. °DIAGNOSIS    °When you have a pain with no known cause, your caregiver will probably ask some specific questions:  °· Do you have any other conditions, such as diabetes, shingles, multiple sclerosis, or HIV infection? °· How would you describe your pain? (Neuropathic pain is often described as shooting, stabbing, burning, or searing.) °· Is your pain worse at any time of the day? (Neuropathic pain is usually worse at night.) °· Does the pain seem to follow a certain physical pathway? °· Does the pain come from an area that has missing or injured nerves? (An example would be phantom limb pain.) °· Is the pain triggered by minor things such as rubbing against the sheets at night? °These questions often help define the type of pain involved. Once your caregiver knows what is happening, treatment can begin. Anticonvulsant, antidepressant drugs, and various pain relievers seem to work in some cases. If another condition, such as diabetes is involved, better management of that disorder may relieve the neuropathic pain.  °TREATMENT  °Neuropathic pain is frequently long-lasting and tends not to respond to treatment with narcotic type pain medication. It may respond well to other drugs such as antiseizure and antidepressant medications. Usually, neuropathic problems do not completely go away, but partial improvement is often possible with proper treatment. Your caregivers have large numbers of medications available to treat you. Do not be discouraged if you do not get immediate relief. Sometimes different medications or a combination of medications will be tried before you receive the results you are hoping for. See your caregiver if you have pain that seems to be coming from nowhere and does not go away. Help is available.  °SEEK IMMEDIATE MEDICAL CARE IF:  °· There is a sudden change in the quality of your pain, especially if the change is on only one side of the body. °· You notice changes of the skin, such as redness, black or  purple discoloration, swelling, or an ulcer. °· You cannot move the affected limbs. °Document Released: 09/29/2003 Document Revised: 03/26/2011 Document Reviewed: 09/29/2003 °ExitCare® Patient Information ©2015 [employer]. This information is not intended to replace advice given to you by your health care provider. Make sure you discuss any questions you have with your health care provider. ° ° °Emergency Department Resource Guide °1) Find a Doctor and Pay Out of Pocket °Although you won't have to find out who is covered by your insurance plan, it is a good idea to ask around and get recommendations. You will then need to call the office and see if the doctor you have chosen will accept you as a new patient and what types of options they offer for patients who are self-pay. Some doctors offer discounts or will set up payment plans for their patients who do not have insurance, but you will need to ask so you aren't surprised when you get to your appointment. ° °2) Contact Your Local Health Department °Not all health departments have doctors that can see patients for sick visits, but many do, so it is worth a call to see if yours does. If you don't know where your local health department is, you can check in your phone book. The CDC also has a tool to help you locate your state's health department, and many state websites also have listings   listings of all of their local health departments.  3) Find a [facility] If your illness is not likely to be very severe or complicated, you may want to try a walk in clinic. These are popping up all over the country in pharmacies, drugstores, and shopping centers. They're usually staffed by nurse practitioners or physician assistants that have been trained to treat common illnesses and complaints. They're usually fairly quick and inexpensive. However, if you have serious medical issues or chronic medical problems, these are probably not your best option.  No Primary Care  Doctor: - Call Health Connect at  [phone] - they can help you locate a primary care doctor that  accepts your insurance, provides certain services, etc. - Physician Referral Service- [phone]  Chronic Pain Problems: Organization         Address  Phone   Notes  [facility]  [phone] Patients need to be referred by their primary care doctor.   Medication Assistance: Organization         Address  Phone   Notes  [facility] Medication [facility] [address] [place], [place] [address] [phone] --Must be a resident of [facility] -- Must have NO insurance coverage whatsoever (no Medicaid/ Medicare, etc.) -- The pt. MUST have a primary care doctor that directs their care regularly and follows them in the community   MedAssist  [phone]   [employer]  [phone]    Agencies that provide inexpensive medical care: Organization         Address  Phone   Notes  [place]  [phone]   [facility]    [phone]   [facility] [place], [place] [address] [phone]   [place] [address] [place] [phone]   Planned Parenthood    [phone]   [facility]    [phone]   [place] and [place] [address], [place] Phone:  [phone], Fax:  [phone] Hours of Operation:  9 am - 6 pm, M-F.  Also accepts Medicaid/Medicare and self-pay.  [facility] for [place] [address], [address], [place] Phone: [phone], Fax: [phone]. Hours of Operation:  8:30 am - 5:30 pm, M-F.  Also accepts Medicaid and self-pay.  [facility] [place] [address], [place] Phone: [phone]   [place], [place], [place] [phone], Ext. [phone] Mondays & Thursdays: 7-9 AM.  First 15 patients are seen on a first  come, first serve basis.    [place] Providers:  Organization         Address  Phone   Notes  [facility] [address], [place] [phone] Also accepts self-pay patients.  [facility] [address], [place]  [phone]   [place], [address], [place] [phone]   [facility] [address], [place] [phone]   Lucianne Lei [address], [place]   [phone] Only accepts [place] Access [place] patients after they have their name applied to their card.   Self-Pay (no insurance) in [facility]:  [employer]  Notes  Sickle Cell Patients, [facility] [address], [place] [phone]) [phone]   [facility] [address], [place] [phone]   [facility] [place]  [address], [place] [phone]) [phone]   [facility]/Dr. Osei-Bonsu  [address] [place] [address], [place] or [phone], [place] [phone] Phone number for both [place] and [place] locations is the same.  [facility] [address], [place] [phone]   [facility] [place] [address] [place] [address], [place] or [address] [phone]) [phone]) [phone]   [facility] [address], [place] [phone](336) [phone], phone; [phone]) [phone], fax Sees patients 1st and 3rd Saturday of every month.  Must not qualify for public or private insurance (i.e. Medicaid, Medicare, [place] Health Choice, Veterans' Benefits)  Household income should be no more than 200% of the poverty level The clinic cannot treat you if you are pregnant or think you are pregnant  Sexually transmitted diseases are not treated at the clinic.    Dental Care: Organization          Address  Phone  Notes  [facility] [address], [place] [phone]) [phone] Accepts children up to age 35 who are enrolled in [place] or [place] Health Choice; pregnant women with a Medicaid card; and children who have applied for Medicaid or [place] Health Choice, but were declined, whose parents can pay a reduced fee at time of service.  [facility] [place]  [address], [place] [phone] Accepts children up to age 35 who are enrolled in [place] or [place] Health Choice; pregnant women with a Medicaid card; and children who have applied for Medicaid or [place] Health Choice, but were declined, whose parents can pay a reduced fee at time of service.  [facility]  [address], [place] [phone]) [phone] Patients are seen by appointment only. Walk-ins are not accepted. [facility] will see patients 35 years of age and older. Monday - Tuesday (8am-5pm) Most Wednesdays (8:30-5pm) $30 per visit, cash only  [facility]  [address], [facility] [phone]) [phone] Patients are seen by appointment only. Walk-ins are not accepted. [facility] will see patients 35 years of age and older. One Wednesday Evening (Monthly: Volunteer Based).  $30 per visit, cash only  [employer] [facility] [facility]  [phone] for adults; Children under age 94, call Graduate Pediatric Dentistry at [phone]) [phone]. Children aged 224-14, please call [phone] to request a pediatric application.  Dental services are provided in all areas of dental care including fillings, crowns and bridges, complete and partial dentures, implants, gum treatment, root canals, and extractions. Preventive care is also provided. Treatment is provided to both adults and children. Patients are selected via a lottery and there is often a waiting list.   [facility] [address], [place]  [phone]) [phone] [email or web address]   [facility] [address], [place], [place] [phone], Ext. [phone] Second and Fourth Thursday of each month, opens at 6:30 AM; Clinic ends at 9 AM.  Patients are seen on a first-come first-served basis, and a limited number are seen during each clinic.   [facility]  [address], [place], [place] [phone]  Eligibility Requirements You must have lived in [place], [place], or [place] counties for at least the last three months.   You cannot be eligible for state or federal sponsored [employer], including [employer], [place], or [employer].   You generally cannot be eligible for healthcare insurance through your employer.    How to apply: Eligibility screenings are held every Tuesday and Wednesday afternoon from 1:00 pm until 4:00 pm. You do not need an appointment for the interview!  [facility] [address] [place], [place]   [place]  [place] Department  [place]  [phone]    Behavioral Health Resources in the Community: Intensive Outpatient Programs Organization         Address  Phone  Notes  [place]enn [place]. [address] [place], [place] [phone]   [facility]patient [address], [place], [place]   ADS: Alcohol & Drug Svcs [address] [place], [place]   [place] [address],  [place], [place] or [phone]   Substance Abuse Resources Organization         Address  Phone  Notes  Alcohol and Drug Services  [phone]   [place]  [phone]   The [place]   Chinita Pester  [phone]   Residential & Outpatient Substance Abuse Program  [phone]   Psychological  Services Organization         Address  Phone  Notes  [facility] [place]  [place]  [phone]   [place] [address], [place] or [phone]    Mobile Crisis Teams Organization         Address  Phone  Notes  [facility], Mobile Crisis Care Unit  [phone]   [facility]  [address] [place], [place]   [place] Levels [address], [place] [phone]    Self-Help/Support Groups Organization         Address  Phone             Notes  [place]. of [place] - variety of support groups  [place] Call for more information  Narcotics Anonymous (NA), [facility] [address], [employer] [place]  2 meetings at this location   Special educational needs teacher         Address  Phone  Notes  ASAP Residential Treatment [place],    [place]  [phone]   [facility]  [address], [place] 779390, [place], [place]   [place], [place] [phone] Admissions: 8am-3pm M-F  Incentives Substance [place] [address],    [place], [place] [phone]   [facility] [address] Jadene Pierini [place], [place]   [facility] [address]  [place], [place]   [facility]ight Programs - Intensive Outpatient [address] Kristeen [address], [place], [place]   [facility] ([place].) [place].,  [place], [place] or [phone]   [facility] (RTS) [address], [place], [place] Accepts Medicaid  [facility] [place] [address]  [place] [phone] Substance Abuse/Addiction Treatment   [facility] Resources Organization  Address  Phone  Notes  [facility]  [phone]   Domenic Schwab, PhD [address] Arlis [place], [place]   [phone] or [phone]   [place], [place] [phone]   [address], [place], [place] [phone] Insurance/Medicaid/sponsorship through [facility] and Families [address] [place]                                    [place], [place] [phone] [place] [address], [place] [phone]    Dr. Adele Schilder  [phone]   [facility] [place] Dept. 1) [address], [place] 2) [place] 3)  [place] [place] [address], [place] [phone]  [phone]   [place] [phone] or [phone] (After Hours)
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
## 2014-07-16 NOTE — ED Notes (Signed)
Bed: WA03 Expected date:  Expected time:  Means of arrival:  Comments: EMS 34 yo male/swollen knee/hypertension

## 2015-02-16 ENCOUNTER — Emergency Department (HOSPITAL_COMMUNITY)
Admission: EM | Admit: 2015-02-16 | Discharge: 2015-02-16 | Disposition: A | Discharge status: Home / Self Care | Payer: Self-pay | Attending: Emergency Medicine | Admitting: Emergency Medicine

## 2015-02-16 ENCOUNTER — Encounter (HOSPITAL_COMMUNITY): Payer: Self-pay

## 2015-02-16 DIAGNOSIS — J069 Acute upper respiratory infection, unspecified: Secondary | ICD-10-CM | POA: Insufficient documentation

## 2015-02-16 DIAGNOSIS — Z87828 Personal history of other (healed) physical injury and trauma: Secondary | ICD-10-CM | POA: Insufficient documentation

## 2015-02-16 DIAGNOSIS — Z9889 Other specified postprocedural states: Secondary | ICD-10-CM | POA: Insufficient documentation

## 2015-02-16 DIAGNOSIS — Z791 Long term (current) use of non-steroidal anti-inflammatories (NSAID): Secondary | ICD-10-CM | POA: Insufficient documentation

## 2015-02-16 DIAGNOSIS — Z79899 Other long term (current) drug therapy: Secondary | ICD-10-CM | POA: Insufficient documentation

## 2015-02-16 DIAGNOSIS — Z792 Long term (current) use of antibiotics: Secondary | ICD-10-CM | POA: Insufficient documentation

## 2015-02-16 DIAGNOSIS — Z7982 Long term (current) use of aspirin: Secondary | ICD-10-CM | POA: Insufficient documentation

## 2015-02-16 DIAGNOSIS — Z87891 Personal history of nicotine dependence: Secondary | ICD-10-CM | POA: Insufficient documentation

## 2015-02-16 MED ORDER — BENZONATATE 100 MG PO CAPS: 100.0000 mg | ORAL_CAPSULE | Freq: Three times a day (TID) | ORAL | Status: DC

## 2015-02-16 MED ORDER — PSEUDOEPHEDRINE-GUAIFENESIN ER 60-600 MG PO TB12: 1.0000 | ORAL_TABLET | Freq: Two times a day (BID) | ORAL | Status: DC

## 2015-02-16 NOTE — ED Notes (Signed)
Pt c/o sore throat, cough, generalized body aches, and fever x "a couple days."  Pain score 6/10.  Pt reports taking Thera-flu and a home remedy w/ relief.  Sts he is coughing up green mucous.

## 2015-02-16 NOTE — Discharge Instructions (Signed)
You were evaluated in the ED today and there does not appear to be an emergent cause her symptoms at this time. They're likely due to a virus and will resolve on their own over the next week or so. Please take your medications as prescribed. Follow-up with your doctor as needed for reevaluation. Return to ED for any new or worsening symptoms.  Upper Respiratory Infection, Adult Most upper respiratory infections (URIs) are a viral infection of the air passages leading to the lungs. A URI affects the nose, throat, and upper air passages. The most common type of URI is nasopharyngitis and is typically referred to as "the common cold." URIs run their course and usually go away on their own. Most of the time, a URI does not require medical attention, but sometimes a bacterial infection in the upper airways can follow a viral infection. This is called a secondary infection. Sinus and middle ear infections are common types of secondary upper respiratory infections. Bacterial pneumonia can also complicate a URI. A URI can worsen asthma and chronic obstructive pulmonary disease (COPD). Sometimes, these complications can require emergency medical care and may be life threatening.  CAUSES Almost all URIs are caused by viruses. A virus is a type of germ and can spread from one person to another.  RISKS FACTORS You may be at risk for a URI if:   You smoke.   You have chronic heart or lung disease.  You have a weakened defense (immune) system.   You are very young or very old.   You have nasal allergies or asthma.  You work in crowded or poorly ventilated areas.  You work in health care facilities or schools. SIGNS AND SYMPTOMS  Symptoms typically develop 2-3 days after you come in contact with a cold virus. Most viral URIs last 7-10 days. However, viral URIs from the influenza virus (flu virus) can last 14-18 days and are typically more severe. Symptoms may include:   Runny or stuffy (congested)  nose.   Sneezing.   Cough.   Sore throat.   Headache.   Fatigue.   Fever.   Loss of appetite.   Pain in your forehead, behind your eyes, and over your cheekbones (sinus pain).  Muscle aches.  DIAGNOSIS  Your health care provider may diagnose a URI by:  Physical exam.  Tests to check that your symptoms are not due to another condition such as:  Strep throat.  Sinusitis.  Pneumonia.  Asthma. TREATMENT  A URI goes away on its own with time. It cannot be cured with medicines, but medicines may be prescribed or recommended to relieve symptoms. Medicines may help:  Reduce your fever.  Reduce your cough.  Relieve nasal congestion. HOME CARE INSTRUCTIONS   Take medicines only as directed by your health care provider.   Gargle warm saltwater or take cough drops to comfort your throat as directed by your health care provider.  Use a warm mist humidifier or inhale steam from a shower to increase air moisture. This may make it easier to breathe.  Drink enough fluid to keep your urine clear or pale yellow.   Eat soups and other clear broths and maintain good nutrition.   Rest as needed.   Return to work when your temperature has returned to normal or as your health care provider advises. You may need to stay home longer to avoid infecting others. You can also use a face mask and careful hand washing to prevent spread of the virus.  Increase the usage of your inhaler if you have asthma.   Do not use any tobacco products, including cigarettes, chewing tobacco, or electronic cigarettes. If you need help quitting, ask your health care provider. PREVENTION  The best way to protect yourself from getting a cold is to practice good hygiene.   Avoid oral or hand contact with people with cold symptoms.   Wash your hands often if contact occurs.  There is no clear evidence that vitamin C, vitamin E, echinacea, or exercise reduces the chance of developing a  cold. However, it is always recommended to get plenty of rest, exercise, and practice good nutrition.  SEEK MEDICAL CARE IF:   You are getting worse rather than better.   Your symptoms are not controlled by medicine.   You have chills.  You have worsening shortness of breath.  You have brown or red mucus.  You have yellow or brown nasal discharge.  You have pain in your face, especially when you bend forward.  You have a fever.  You have swollen neck glands.  You have pain while swallowing.  You have white areas in the back of your throat. SEEK IMMEDIATE MEDICAL CARE IF:   You have severe or persistent:  Headache.  Ear pain.  Sinus pain.  Chest pain.  You have chronic lung disease and any of the following:  Wheezing.  Prolonged cough.  Coughing up blood.  A change in your usual mucus.  You have a stiff neck.  You have changes in your:  Vision.  Hearing.  Thinking.  Mood. MAKE SURE YOU:   Understand these instructions.  Will watch your condition.  Will get help right away if you are not doing well or get worse.   This information is not intended to replace advice given to you by your health care provider. Make sure you discuss any questions you have with your health care provider.   Document Released: 06/27/2000 Document Revised: 05/18/2014 Document Reviewed: 04/08/2013 Elsevier Interactive Patient Education [employer]
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
## 2015-03-17 DIAGNOSIS — Z87891 Personal history of nicotine dependence: Secondary | ICD-10-CM | POA: Insufficient documentation

## 2015-03-17 DIAGNOSIS — Z87828 Personal history of other (healed) physical injury and trauma: Secondary | ICD-10-CM | POA: Insufficient documentation

## 2015-03-17 DIAGNOSIS — J069 Acute upper respiratory infection, unspecified: Secondary | ICD-10-CM | POA: Insufficient documentation

## 2015-03-17 DIAGNOSIS — J9801 Acute bronchospasm: Secondary | ICD-10-CM | POA: Insufficient documentation

## 2015-03-18 ENCOUNTER — Encounter (HOSPITAL_COMMUNITY): Payer: Self-pay | Admitting: Emergency Medicine

## 2015-03-18 ENCOUNTER — Emergency Department (HOSPITAL_COMMUNITY)
Admission: EM | Admit: 2015-03-18 | Discharge: 2015-03-18 | Disposition: A | Discharge status: Home / Self Care | Payer: Self-pay | Attending: Emergency Medicine | Admitting: Emergency Medicine

## 2015-03-18 DIAGNOSIS — J069 Acute upper respiratory infection, unspecified: Secondary | ICD-10-CM

## 2015-03-18 DIAGNOSIS — J9801 Acute bronchospasm: Secondary | ICD-10-CM

## 2015-03-18 MED ORDER — ALBUTEROL SULFATE HFA 108 (90 BASE) MCG/ACT IN AERS
2.0000 | INHALATION_SPRAY | Freq: Once | RESPIRATORY_TRACT | Status: AC
  Administered 2015-03-18: 2 via RESPIRATORY_TRACT
  Filled 2015-03-18: qty 6.7
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
## 2015-03-18 NOTE — Discharge Instructions (Signed)
Use inhaler for wheezing as needed.  If you were given medicines take as directed.  If you are on coumadin or contraceptives realize their levels and effectiveness is altered by many different medicines.  If you have any reaction (rash, tongues swelling, other) to the medicines stop taking and see a physician.    If your blood pressure was elevated in the ER make sure you follow up for management with a primary doctor or return for chest pain, shortness of breath or stroke symptoms.  Please follow up as directed and return to the ER or see a physician for new or worsening symptoms.  Thank you. Filed Vitals:   03/18/15 0002  BP: 124/68  Pulse: 62  Temp: 97.7 F (36.5 C)  TempSrc: Oral  Resp: 16  SpO2: 98%

## 2015-03-18 NOTE — ED Notes (Signed)
Pt. reports " I'm coming down with a cold " ,  states productive cough / chest congestion with bloody phlegm , nasal congestion and runny nose onset this evening , denies fever or chills.

## 2015-04-11 ENCOUNTER — Encounter (HOSPITAL_COMMUNITY): Payer: Self-pay | Admitting: *Deleted

## 2015-04-11 ENCOUNTER — Emergency Department (HOSPITAL_COMMUNITY)
Admission: EM | Admit: 2015-04-11 | Discharge: 2015-04-12 | Disposition: A | Discharge status: Home / Self Care | Payer: Self-pay | Attending: Emergency Medicine | Admitting: Emergency Medicine

## 2015-04-11 DIAGNOSIS — L52 Erythema nodosum: Secondary | ICD-10-CM | POA: Insufficient documentation

## 2015-04-11 DIAGNOSIS — Z87891 Personal history of nicotine dependence: Secondary | ICD-10-CM | POA: Insufficient documentation

## 2015-04-11 DIAGNOSIS — Z87828 Personal history of other (healed) physical injury and trauma: Secondary | ICD-10-CM | POA: Insufficient documentation

## 2015-04-11 LAB — CBC WITH DIFFERENTIAL/PLATELET
BASOS PCT: 1 %
Basophils Absolute: 0 10*3/uL (ref 0.0–0.1)
Eosinophils Absolute: 0.2 10*3/uL (ref 0.0–0.7)
Eosinophils Relative: 3 %
HEMATOCRIT: 43.4 % (ref 39.0–52.0)
HEMOGLOBIN: 13.8 g/dL (ref 13.0–17.0)
LYMPHS PCT: 43 %
Lymphs Abs: 2.5 10*3/uL (ref 0.7–4.0)
MCH: 29.9 pg (ref 26.0–34.0)
MCHC: 31.8 g/dL (ref 30.0–36.0)
MCV: 94.1 fL (ref 78.0–100.0)
MONOS PCT: 14 %
Monocytes Absolute: 0.8 10*3/uL (ref 0.1–1.0)
Neutro Abs: 2.2 10*3/uL (ref 1.7–7.7)
Neutrophils Relative %: 39 %
Platelets: 246 10*3/uL (ref 150–400)
RBC: 4.61 MIL/uL (ref 4.22–5.81)
RDW: 13.9 % (ref 11.5–15.5)
WBC: 5.8 10*3/uL (ref 4.0–10.5)

## 2015-04-11 LAB — URINALYSIS, ROUTINE W REFLEX MICROSCOPIC
GLUCOSE, UA: NEGATIVE mg/dL
Hgb urine dipstick: NEGATIVE
KETONES UR: 15 mg/dL — AB
Leukocytes, UA: NEGATIVE
NITRITE: NEGATIVE
PH: 6 (ref 5.0–8.0)
Protein, ur: 30 mg/dL — AB
SPECIFIC GRAVITY, URINE: 1.031 — AB (ref 1.005–1.030)

## 2015-04-11 LAB — URINE MICROSCOPIC-ADD ON

## 2015-04-11 MED ORDER — SODIUM CHLORIDE 0.9 % IV BOLUS (SEPSIS)
1000.0000 mL | Freq: Once | INTRAVENOUS | Status: AC
  Administered 2015-04-11: 1000 mL via INTRAVENOUS

## 2015-04-11 MED ORDER — ACETAMINOPHEN 500 MG PO TABS
1000.0000 mg | ORAL_TABLET | Freq: Once | ORAL | Status: AC
  Administered 2015-04-11: 1000 mg via ORAL
  Filled 2015-04-11: qty 2

## 2015-04-11 MED ORDER — KETOROLAC TROMETHAMINE 30 MG/ML IJ SOLN
30.0000 mg | Freq: Once | INTRAMUSCULAR | Status: AC
  Administered 2015-04-11: 30 mg via INTRAVENOUS
  Filled 2015-04-11: qty 1

## 2015-04-11 NOTE — ED Notes (Signed)
Pt reports having recent gi virus with n/v/d. Now has multiple red bumps or abscess to his legs. Reports they are causing severe pain and feels like his legs are going to give out. Ambulatory at triage with no acute distress.
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
## 2015-04-12 LAB — COMPREHENSIVE METABOLIC PANEL
ALT: 26 U/L (ref 17–63)
AST: 28 U/L (ref 15–41)
Albumin: 3.7 g/dL (ref 3.5–5.0)
Alkaline Phosphatase: 87 U/L (ref 38–126)
Anion gap: 8 (ref 5–15)
BUN: 6 mg/dL (ref 6–20)
CHLORIDE: 102 mmol/L (ref 101–111)
CO2: 30 mmol/L (ref 22–32)
Calcium: 8.9 mg/dL (ref 8.9–10.3)
Creatinine, Ser: 1.03 mg/dL (ref 0.61–1.24)
Glucose, Bld: 109 mg/dL — ABNORMAL HIGH (ref 65–99)
POTASSIUM: 3.6 mmol/L (ref 3.5–5.1)
Sodium: 140 mmol/L (ref 135–145)
Total Bilirubin: 0.4 mg/dL (ref 0.3–1.2)
Total Protein: 6.7 g/dL (ref 6.5–8.1)

## 2015-04-12 NOTE — Discharge Instructions (Signed)
Take 4 over the counter ibuprofen tablets 3 times a day or 2 over-the-counter naproxen tablets twice a day for pain.  Erythema Nodosum Erythema nodosum is a skin condition in which patches of fat under the skin of the lower legs become inflamed. This causes painful bumps (nodules) to form. CAUSES Common causes of this condition include:  Infections.  Certain medicines, especially birth control pills, penicillin, and sulfa medicines. Other causes include:  Pregnancy.  Certain inflammatory conditions, including Lupus, Crohn's disease, and thyroid conditions. In some cases, the cause may not be known. RISK FACTORS This condition is more likely to develop in young adult women. SYMPTOMS The main symptom of this condition is large nodules that look like raised bruises and are tender to the touch. These nodules usually appear on the shins, but they may also appear on the arms or the trunk. They gradually change in color from pink to brown, and they leave a dark mark that clears up in several months. Other symptoms include:  Fever.  Fatigue.  Joint pain.  Itchiness. DIAGNOSIS This condition is diagnosed based on symptoms. To find the underlying condition that caused the erythema nodosum, your health care provider may also do a physical exam, X-rays, and blood tests. TREATMENT Treatment for this condition depends on the cause. The nodules usually go away with treatment of the underlying condition. Any pain or discomfort may be treated with:  Anti-inflammatory medicines.  Bed rest.  Raising (elevating) the affected area.  Cool compresses. In some cases, steroids and potassium iodide tablets may be given. HOME CARE INSTRUCTIONS  Take medicines only as directed by your health care provider.  Stay in bed for as long as directed by your health care provider.  Until your symptoms go away, limit any exercising that makes you breathe harder and faster (vigorous).  Elevate the  affected leg as directed by your health care provider.  Apply cool compresses to the affected area as directed by your health care provider. SEEK MEDICAL CARE IF:  Your symptoms are not improving.  You have a fever that does not go away. SEEK IMMEDIATE MEDICAL CARE IF:  Your condition gets worse.  Your pain gets worse.  You have a sore throat.  You vomit repeatedly.   This information is not intended to replace advice given to you by your health care provider. Make sure you discuss any questions you have with your health care provider.   Document Released: 02/09/2004 Document Revised: 05/18/2014 Document Reviewed: 12/09/2013 Elsevier Interactive Patient Education [employer]2016 Elsevier Inc.

## 2015-04-12 NOTE — ED Notes (Signed)
Pt verbalized understanding of d/c instructions and has no further questions. Pt stable and NAD.  

## 2015-04-22 ENCOUNTER — Encounter (HOSPITAL_BASED_OUTPATIENT_CLINIC_OR_DEPARTMENT_OTHER): Payer: Self-pay | Admitting: *Deleted

## 2015-04-22 ENCOUNTER — Emergency Department (HOSPITAL_BASED_OUTPATIENT_CLINIC_OR_DEPARTMENT_OTHER)
Admission: EM | Admit: 2015-04-22 | Discharge: 2015-04-22 | Disposition: A | Discharge status: Home / Self Care | Payer: BLUE CROSS/BLUE SHIELD | Attending: Emergency Medicine | Admitting: Emergency Medicine

## 2015-04-22 DIAGNOSIS — I781 Nevus, non-neoplastic: Secondary | ICD-10-CM | POA: Diagnosis not present

## 2015-04-22 DIAGNOSIS — Z87891 Personal history of nicotine dependence: Secondary | ICD-10-CM | POA: Insufficient documentation

## 2015-04-22 DIAGNOSIS — M79605 Pain in left leg: Secondary | ICD-10-CM | POA: Diagnosis present

## 2015-04-22 MED ORDER — KETOROLAC TROMETHAMINE 60 MG/2ML IM SOLN
60.0000 mg | Freq: Once | INTRAMUSCULAR | Status: AC
  Administered 2015-04-22: 60 mg via INTRAMUSCULAR
  Filled 2015-04-22: qty 2

## 2015-04-22 MED ORDER — ACETAMINOPHEN 500 MG PO TABS
1000.0000 mg | ORAL_TABLET | Freq: Once | ORAL | Status: AC
  Administered 2015-04-22: 1000 mg via ORAL
  Filled 2015-04-22: qty 2
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
## 2015-04-22 NOTE — ED Notes (Signed)
Pt given d/c instructions as per chart. Verbalizes understanding. No questions. 

## 2015-04-22 NOTE — ED Notes (Signed)
Pt c/o bilat leg pain x 1 month. Has appt with vein specialist tomorrow, but was working tonight and his right leg gave out. Describes broken veins in legs and poor circulation.

## 2015-04-22 NOTE — ED Notes (Signed)
MD at bedside. 

## 2015-05-24 ENCOUNTER — Ambulatory Visit (INDEPENDENT_AMBULATORY_CARE_PROVIDER_SITE_OTHER): Payer: BLUE CROSS/BLUE SHIELD | Admitting: Internal Medicine

## 2015-05-24 VITALS — BP 118/82 | HR 77 | Temp 98.3°F | Resp 18 | Ht 75.0 in | Wt 252.4 lb

## 2015-05-24 DIAGNOSIS — J029 Acute pharyngitis, unspecified: Secondary | ICD-10-CM | POA: Diagnosis not present

## 2015-05-24 LAB — POCT RAPID STREP A (OFFICE): Rapid Strep A Screen: NEGATIVE

## 2015-05-24 MED ORDER — LIDOCAINE VISCOUS 2 % MT SOLN: OROMUCOSAL | Status: DC

## 2015-05-24 NOTE — Progress Notes (Signed)
   Subjective:  By signing my name below, I, Stann Oresung-Kai Tsai, attest that this documentation has been prepared under the direction and in the presence of Ellamae Siaobert Doolittle, MD. Electronically Signed: Stann Oresung-Kai Tsai, Scribe. 05/24/2015 , 3:37 PM .  Patient was seen in Room 11 .   Patient ID: Henry Ewing, male    DOB: March 17, 1980, 35 y.o.   MRN: [id number] Chief Complaint  Patient presents with  . Sore Throat    started over the past weekend after taking Doxy 100mg     HPI Henry Ewing is a 35 y.o. male who presents to [facility] complaining of sore throat that started 2 days ago. He mentions having nasal congestion and noticed white spots in the back of his throat. He had difficulty breathing through his nose. He denies having a fever.   There are no active problems to display for this patient.   Current outpatient prescriptions:  .  doxycycline (VIBRAMYCIN) 100 MG capsule, Take 100 mg by mouth 2 (two) times daily., Disp: , Rfl:  .  fexofenadine (ALLEGRA) 180 MG tablet, Take 180 mg by mouth daily., Disp: , Rfl:  .  ibuprofen (ADVIL,MOTRIN) 100 MG tablet, Take 100 mg by mouth every 6 (six) hours as needed for fever., Disp: , Rfl:  .  traMADol (ULTRAM) 50 MG tablet, Take 50 mg by mouth every 6 (six) hours as needed for moderate pain. Reported on 05/24/2015, Disp: , Rfl:  No Known Allergies  Review of Systems  Constitutional: Positive for activity change and fatigue. Negative for fever and chills.  HENT: Positive for congestion and sore throat. Negative for postnasal drip, rhinorrhea and sinus pressure.   Respiratory: Negative for cough.   Gastrointestinal: Negative for nausea, vomiting and diarrhea.      Objective:   Physical Exam  Constitutional: He is oriented to person, place, and time. He appears well-developed and well-nourished. No distress.  HENT:  Head: Normocephalic and atraumatic.  Mouth/Throat: Oropharyngeal exudate and posterior oropharyngeal erythema present.  Eyes: EOM are  normal. Pupils are equal, round, and reactive to light.  Neck: Neck supple.  Cardiovascular: Normal rate.   Pulmonary/Chest: Effort normal. No respiratory distress.  Musculoskeletal: Normal range of motion.  Lymphadenopathy:  Tender AC nodes  Neurological: He is alert and oriented to person, place, and time.  Skin: Skin is warm and dry.  Psychiatric: He has a normal mood and affect. His behavior is normal.  Nursing note and vitals reviewed.  BP 118/82 mmHg  Pulse 77  Temp(Src) 98.3 F (36.8 C) (Oral)  Resp 18  Ht 6\' 3"  (1.905 m)  Wt 252 lb 6.4 oz (114.488 kg)  BMI 31.55 kg/m2  SpO2 96%   Results for orders placed or performed in visit on 05/24/15  POCT rapid strep A  Result Value Ref Range   Rapid Strep A Screen Negative Negative      Assessment & Plan:  Sore throat - Plan: POCT rapid strep A, Culture, Group A Strep  Throat cult sent Add sudafed 12h plus motrin Use vis xylo cintinue doxy(on this since 5/5 for skin infection of feet) OOW 1 d

## 2015-05-24 NOTE — Patient Instructions (Signed)
     IF you received an x-ray today, you will receive an invoice from [facility]. Please contact [facility] at [phone] with questions or concerns regarding your invoice.   IF you received labwork today, you will receive an invoice from [facility]. Please contact [facility] at [phone] with questions or concerns regarding your invoice.   Our billing staff will not be able to assist you with questions regarding bills from these companies.  You will be contacted with the lab results as soon as they are available. The fastest way to get your results is to activate your My Chart account. Instructions are located on the last page of this paperwork. If you have not heard from us regarding the results in 2 weeks, please contact this office.      

## 2015-05-25 ENCOUNTER — Telehealth: Payer: Self-pay

## 2015-05-25 NOTE — Telephone Encounter (Signed)
Patient was here yesterday and never received a refill for tramadol. Please call when ready!  [phone]

## 2015-05-25 NOTE — Telephone Encounter (Signed)
Patient is calling back because he hasn't heard anything. Patient states that getting tramadol was discussed during the visit yesterday. Please call when ready. Patient needs the prescription today. [phone]

## 2015-05-25 NOTE — Telephone Encounter (Signed)
He did not discuss this with me--why does he need tramadol?? We have never prescribed this for him--he was here for a sore throat--given vis xylo for that--perhaps tramadol discussed at triage but not with me and there is no indication in chart of where he's getting this rx that I can see

## 2015-05-25 NOTE — Telephone Encounter (Signed)
Pt advised operator that this med was discussed at OV and he thought that Rx had been written/sent. I do not see any mention of it in OV notes. Please advise.

## 2015-05-26 ENCOUNTER — Emergency Department (HOSPITAL_COMMUNITY)
Admission: EM | Admit: 2015-05-26 | Discharge: 2015-05-26 | Disposition: A | Discharge status: Home / Self Care | Payer: BLUE CROSS/BLUE SHIELD | Attending: Emergency Medicine | Admitting: Emergency Medicine

## 2015-05-26 ENCOUNTER — Encounter (HOSPITAL_COMMUNITY): Payer: Self-pay | Admitting: Emergency Medicine

## 2015-05-26 ENCOUNTER — Telehealth: Payer: Self-pay

## 2015-05-26 DIAGNOSIS — Z87828 Personal history of other (healed) physical injury and trauma: Secondary | ICD-10-CM | POA: Insufficient documentation

## 2015-05-26 DIAGNOSIS — Z792 Long term (current) use of antibiotics: Secondary | ICD-10-CM | POA: Insufficient documentation

## 2015-05-26 DIAGNOSIS — H9202 Otalgia, left ear: Secondary | ICD-10-CM | POA: Diagnosis not present

## 2015-05-26 DIAGNOSIS — Z79899 Other long term (current) drug therapy: Secondary | ICD-10-CM | POA: Diagnosis not present

## 2015-05-26 DIAGNOSIS — J029 Acute pharyngitis, unspecified: Secondary | ICD-10-CM | POA: Insufficient documentation

## 2015-05-26 DIAGNOSIS — Z87891 Personal history of nicotine dependence: Secondary | ICD-10-CM | POA: Insufficient documentation

## 2015-05-26 LAB — CULTURE, GROUP A STREP: Organism ID, Bacteria: NORMAL

## 2015-05-26 LAB — RAPID STREP SCREEN (MED CTR MEBANE ONLY): STREPTOCOCCUS, GROUP A SCREEN (DIRECT): NEGATIVE

## 2015-05-26 MED ORDER — NAPROXEN 250 MG PO TABS: 250.0000 mg | ORAL_TABLET | Freq: Two times a day (BID) | ORAL | Status: DC

## 2015-05-26 MED ORDER — DEXAMETHASONE 4 MG PO TABS: 10.0000 mg | ORAL_TABLET | Freq: Once | ORAL | Status: DC

## 2015-05-26 MED ORDER — ACETAMINOPHEN 325 MG PO TABS
650.0000 mg | ORAL_TABLET | Freq: Once | ORAL | Status: AC | PRN
  Administered 2015-05-26: 650 mg via ORAL

## 2015-05-26 MED ORDER — DEXAMETHASONE 10 MG/ML FOR PEDIATRIC ORAL USE
10.0000 mg | Freq: Once | INTRAMUSCULAR | Status: DC
  Filled 2015-05-26: qty 1

## 2015-05-26 MED ORDER — DEXAMETHASONE SODIUM PHOSPHATE 10 MG/ML IJ SOLN
10.0000 mg | Freq: Once | INTRAMUSCULAR | Status: AC
  Administered 2015-05-26: 10 mg
  Filled 2015-05-26: qty 1

## 2015-05-26 MED ORDER — ACETAMINOPHEN 325 MG PO TABS
ORAL_TABLET | ORAL | Status: AC
  Filled 2015-05-26: qty 2

## 2015-05-26 NOTE — ED Notes (Signed)
Patient able to ambulate independently  

## 2015-05-26 NOTE — Telephone Encounter (Signed)
Spoke with pt, he says hes experiencing more pain. Advised pt to come back in for OV

## 2015-05-26 NOTE — Discharge Instructions (Signed)

## 2015-05-26 NOTE — Telephone Encounter (Signed)
Spoke with pt, advised him to come in since symptoms are getting worse and throat culture was negative
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
## 2015-05-26 NOTE — ED Notes (Signed)
Pt here with sore throat-- red, swollen, white patches on tonsils--- was seen at urgent care on [address] 2 days ago for same-- has been running a fever for 2 days.

## 2015-05-26 NOTE — Telephone Encounter (Signed)
Patient is calling for lab results [phone]  His throat is worse than when he came in.

## 2015-05-28 LAB — CULTURE, GROUP A STREP (THRC)

## 2015-05-30 DIAGNOSIS — J039 Acute tonsillitis, unspecified: Secondary | ICD-10-CM | POA: Insufficient documentation

## 2015-07-01 ENCOUNTER — Ambulatory Visit (INDEPENDENT_AMBULATORY_CARE_PROVIDER_SITE_OTHER): Payer: BLUE CROSS/BLUE SHIELD | Admitting: Internal Medicine

## 2015-07-01 ENCOUNTER — Ambulatory Visit (INDEPENDENT_AMBULATORY_CARE_PROVIDER_SITE_OTHER): Payer: BLUE CROSS/BLUE SHIELD

## 2015-07-01 VITALS — BP 140/90 | HR 82 | Temp 98.1°F | Resp 15 | Ht 75.0 in | Wt 244.0 lb

## 2015-07-01 DIAGNOSIS — M25422 Effusion, left elbow: Secondary | ICD-10-CM

## 2015-07-01 DIAGNOSIS — M25522 Pain in left elbow: Secondary | ICD-10-CM

## 2015-07-01 MED ORDER — PREDNISONE 20 MG PO TABS: ORAL_TABLET | ORAL | Status: DC

## 2015-07-01 MED ORDER — MELOXICAM 15 MG PO TABS: 15.0000 mg | ORAL_TABLET | Freq: Every day | ORAL | Status: DC

## 2015-07-01 NOTE — Patient Instructions (Signed)
     IF you received an x-ray today, you will receive an invoice from [facility]. Please contact [facility] at [phone] with questions or concerns regarding your invoice.   IF you received labwork today, you will receive an invoice from [facility]. Please contact [facility] at [phone] with questions or concerns regarding your invoice.   Our billing staff will not be able to assist you with questions regarding bills from these companies.  You will be contacted with the lab results as soon as they are available. The fastest way to get your results is to activate your My Chart account. Instructions are located on the last page of this paperwork. If you have not heard from us regarding the results in 2 weeks, please contact this office.      
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
## 2015-07-25 ENCOUNTER — Ambulatory Visit (INDEPENDENT_AMBULATORY_CARE_PROVIDER_SITE_OTHER): Payer: BLUE CROSS/BLUE SHIELD

## 2015-07-25 ENCOUNTER — Ambulatory Visit (INDEPENDENT_AMBULATORY_CARE_PROVIDER_SITE_OTHER): Payer: BLUE CROSS/BLUE SHIELD | Admitting: Urgent Care

## 2015-07-25 VITALS — BP 132/80 | HR 80 | Temp 97.7°F | Resp 18 | Ht 75.0 in | Wt 242.0 lb

## 2015-07-25 DIAGNOSIS — M7702 Medial epicondylitis, left elbow: Secondary | ICD-10-CM

## 2015-07-25 DIAGNOSIS — M25422 Effusion, left elbow: Secondary | ICD-10-CM

## 2015-07-25 DIAGNOSIS — M25522 Pain in left elbow: Secondary | ICD-10-CM

## 2015-07-25 LAB — POCT CBC
Granulocyte percent: 67.4 %G (ref 37–80)
HEMATOCRIT: 38.9 % — AB (ref 43.5–53.7)
HEMOGLOBIN: 13.2 g/dL — AB (ref 14.1–18.1)
LYMPH, POC: 1.9 (ref 0.6–3.4)
MCH, POC: 30.8 pg (ref 27–31.2)
MCHC: 34.1 g/dL (ref 31.8–35.4)
MCV: 90.3 fL (ref 80–97)
MID (cbc): 0.5 (ref 0–0.9)
MPV: 6.8 fL (ref 0–99.8)
PLATELET COUNT, POC: 263 10*3/uL (ref 142–424)
POC GRANULOCYTE: 5 (ref 2–6.9)
POC LYMPH PERCENT: 25.7 %L (ref 10–50)
POC MID %: 6.9 %M (ref 0–12)
RBC: 4.3 M/uL — AB (ref 4.69–6.13)
RDW, POC: 14.9 %
WBC: 4.6 10*3/uL (ref 4.6–10.2)

## 2015-07-25 MED ORDER — NAPROXEN SODIUM 550 MG PO TABS: 550.0000 mg | ORAL_TABLET | Freq: Two times a day (BID) | ORAL | Status: DC

## 2015-07-25 NOTE — Addendum Note (Signed)
Addended by: Wallis BambergMANI, Deante Blough on: 07/25/2015 05:24 PM   Modules accepted: Orders

## 2015-07-25 NOTE — Patient Instructions (Addendum)
For stretches, do each one for 10 seconds, alternate and do 5 sets. For exercises, do each one for 10 repetitions, alternate and do 5 sets.   Use a elbow strap with the air compression on the inside of your elbow while at work. Ice for 20 minutes once when you get home from work.   Medial Epicondylitis With Rehab Medial epicondylitis involves inflammation and pain around the inner (medial) portion of the elbow. This pain is caused by inflammation of the tendons in the forearm that flex (bring down) the wrist. Medial epicondylitis is also called golfer's elbow, because it is common among golfers. However, it may occur in any individual who flexes the wrist regularly. If medial epicondylitis is left untreated, it may become a chronic problem. SYMPTOMS   Pain, tenderness, or inflammation over the inner (medial) side of the elbow.  Pain or weakness with gripping activities.  Pain that increases with wrist twisting motions (using a screwdriver, playing golf, bowling). CAUSES  Medial epicondylitis is caused by inflammation of the tendons that flex the wrist. Causes of injury may include:  Chronic, repetitive stress and strain to the tendons that run from the wrist and forearm to the elbow.  Sudden strain on the forearm, including wrist snap when serving balls with racquet sports, or throwing a baseball. RISK INCREASES WITH:  Sports or occupations that require repetitive and/or strenuous forearm and wrist movements (pitching a baseball, golfing, carpentry).  Poor wrist and forearm strength and flexibility.  Failure to warm up properly before activity.  Resuming activity before healing, rehabilitation, and conditioning are complete. PREVENTION   Warm up and stretch properly before activity.  Maintain physical fitness:  Strength, flexibility, and endurance.  Cardiovascular fitness.  Wear and use properly fitted equipment.  Learn and use proper technique and have a coach correct  improper technique.  Wear a tennis elbow (counterforce) brace. PROGNOSIS  The course of this condition depends on the degree of the injury. If treated properly, acute cases (symptoms lasting less than 4 weeks) are often resolved in 2 to 6 weeks. Chronic (longer lasting cases) often resolve in 3 to 6 months, but may require physical therapy. RELATED COMPLICATIONS   Frequently recurring symptoms, resulting in a chronic problem. Properly treating the problem the first time decreases frequency of recurrence.  Chronic inflammation, scarring, and partial tendon tear, requiring surgery.  Delayed healing or resolution of symptoms. TREATMENT  Treatment first involves the use of ice and medicine, to reduce pain and inflammation. Strengthening and stretching exercises may reduce discomfort, if performed regularly. These exercises may be performed at home, if the condition is an acute injury. Chronic cases may require a referral to a physical therapist for evaluation and treatment. Your caregiver may advise a corticosteroid injection to help reduce inflammation. Rarely, surgery is needed. MEDICATION  If pain medicine is needed, nonsteroidal anti-inflammatory medicines (aspirin and ibuprofen), or other minor pain relievers (acetaminophen), are often advised.  Do not take pain medicine for 7 days before surgery.  Prescription pain relievers may be given, if your caregiver thinks they are needed. Use only as directed and only as much as you need.  Corticosteroid injections may be recommended. These injections should be reserved only for the most severe cases, because they can only be given a certain number of times. HEAT AND COLD  Cold treatment (icing) should be applied for 10 to 15 minutes every 2 to 3 hours for inflammation and pain, and immediately after activity that aggravates your symptoms. Use ice  packs or an ice massage.  Heat treatment may be used before performing stretching and strengthening  activities prescribed by your caregiver, physical therapist, or athletic trainer. Use a heat pack or a warm water soak. SEEK MEDICAL CARE IF: Symptoms get worse or do not improve in 2 weeks, despite treatment. EXERCISES  RANGE OF MOTION (ROM) AND STRETCHING EXERCISES - Epicondylitis, Medial (Golfer's Elbow) These exercises may help you when beginning to rehabilitate your injury. Your symptoms may go away with or without further involvement from your physician, physical therapist or athletic trainer. While completing these exercises, remember:   Restoring tissue flexibility helps normal motion to return to the joints. This allows healthier, less painful movement and activity.  An effective stretch should be held for at least 30 seconds.  A stretch should never be painful. You should only feel a gentle lengthening or release in the stretched tissue. RANGE OF MOTION - Wrist Flexion, Active-Assisted  Extend your right / left elbow with your fingers pointing down.*  Gently pull the back of your hand towards you, until you feel a gentle stretch on the top of your forearm.  Hold this position for __________ seconds. Repeat __________ times. Complete this exercise __________ times per day.  *If directed by your physician, physical therapist or athletic trainer, complete this stretch with your elbow bent, rather than extended. RANGE OF MOTION - Wrist Extension, Active-Assisted  Extend your right / left elbow and turn your palm upwards.*  Gently pull your palm and fingertips back, so your wrist extends and your fingers point more toward the ground.  You should feel a gentle stretch on the inside of your forearm.  Hold this position for __________ seconds. Repeat __________ times. Complete this exercise __________ times per day. *If directed by your physician, physical therapist or athletic trainer, complete this stretch with your elbow bent, rather than extended. STRETCH - Wrist Extension    Place your right / left fingertips on a tabletop leaving your elbow slightly bent. Your fingers should point backwards.  Gently press your fingers and palm down onto the table, by straightening your elbow. You should feel a stretch on the inside of your forearm.  Hold this position for __________ seconds. Repeat __________ times. Complete this stretch __________ times per day.  STRENGTHENING EXERCISES - Epicondylitis, Medial (Golfer's Elbow) These exercises may help you when beginning to rehabilitate your injury. They may resolve your symptoms with or without further involvement from your physician, physical therapist or athletic trainer. While completing these exercises, remember:   Muscles can gain both the endurance and the strength needed for everyday activities through controlled exercises.  Complete these exercises as instructed by your physician, physical therapist or athletic trainer. Increase the resistance and repetitions only as guided.  You may experience muscle soreness or fatigue, but the pain or discomfort you are trying to eliminate should never worsen during these exercises. If this pain does get worse, stop and make sure you are following the directions exactly. If the pain is still present after adjustments, discontinue the exercise until you can discuss the trouble with your caregiver. STRENGTH - Wrist Flexors  Sit with your right / left forearm palm-up, and fully supported on a table or countertop. Your elbow should be resting below the height of your shoulder. Allow your wrist to extend over the edge of the surface.  Loosely holding a __________ weight, or a piece of rubber exercise band or tubing, slowly curl your hand up toward your forearm.  Hold  this position for __________ seconds. Slowly lower the wrist back to the starting position in a controlled manner. Repeat __________ times. Complete this exercise __________ times per day.  STRENGTH - Wrist Extensors  Sit  with your right / left forearm palm-down and fully supported. Your elbow should be resting below the height of your shoulder. Allow your wrist to extend over the edge of the surface.  Loosely holding a __________ weight, or a piece of rubber exercise band or tubing, slowly curl your hand up toward your forearm.  Hold this position for __________ seconds. Slowly lower the wrist back to the starting position in a controlled manner. Repeat __________ times. Complete this exercise __________ times per day.  STRENGTH - Ulnar Deviators  Stand with a ____________________ weight in your right / left hand, or sit while holding a rubber exercise band or tubing, with your healthy arm supported on a table or countertop.  Move your wrist so that your pinkie travels toward your forearm and your thumb moves away from your forearm.  Hold this position for __________ seconds and then slowly lower the wrist back to the starting position. Repeat __________ times. Complete this exercise __________ times per day STRENGTH - Grip   Grasp a tennis ball, a dense sponge, or a large, rolled sock in your hand.  Squeeze as hard as you can, without increasing any pain.  Hold this position for __________ seconds. Release your grip slowly. Repeat __________ times. Complete this exercise __________ times per day.  STRENGTH - Forearm Supinators   Sit with your right / left forearm supported on a table, keeping your elbow below shoulder height. Rest your hand over the edge, palm down.  Gently grip a hammer or a soup ladle.  Without moving your elbow, slowly turn your palm and hand upward to a "thumbs-up" position.  Hold this position for __________ seconds. Slowly return to the starting position. Repeat __________ times. Complete this exercise __________ times per day.  STRENGTH - Forearm Pronators  Sit with your right / left forearm supported on a table, keeping your elbow below shoulder height. Rest your hand over  the edge, palm up.  Gently grip a hammer or a soup ladle.  Without moving your elbow, slowly turn your palm and hand upward to a "thumbs-up" position.  Hold this position for __________ seconds. Slowly return to the starting position. Repeat __________ times. Complete this exercise __________ times per day.    This information is not intended to replace advice given to you by your health care provider. Make sure you discuss any questions you have with your health care provider.   Document Released: 01/01/2005 Document Revised: 01/22/2014 Document Reviewed: 04/15/2008 Elsevier Interactive Patient Education 2016 [employer]     IF you received an x-ray today, you will receive an invoice from [facility]. Please contact [facility] at [phone] with questions or concerns regarding your invoice.   IF you received labwork today, you will receive an invoice from [facility]. Please contact [facility] at [phone] with questions or concerns regarding your invoice.   Our billing staff will not be able to assist you with questions regarding bills from these companies.  You will be contacted with the lab results as soon as they are available. The fastest way to get your results is to activate your My Chart account. Instructions are located on the last page of this paperwork. If you have not heard from us regarding the results in 2 weeks, please  contact this office.
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
## 2015-07-27 ENCOUNTER — Telehealth: Payer: Self-pay

## 2015-07-27 NOTE — Telephone Encounter (Signed)
Patient is requesting release for work with NO RESTRICTIONS return to work on July 28, 2015  Please call patient when ready [phone] (H)

## 2015-07-27 NOTE — Telephone Encounter (Signed)
This is okay. Please provide the work note. But please tell patient that he needs to take Anaprox, wear the elbow strap, ice for 20 minutes after work.

## 2015-08-01 NOTE — Telephone Encounter (Signed)
Left a vm to see if pt. Ever received his letter I see that Urban GibsonMani did write a letter on 7/13 but I do not see where someone called him to pick it up

## 2015-08-30 ENCOUNTER — Ambulatory Visit (INDEPENDENT_AMBULATORY_CARE_PROVIDER_SITE_OTHER): Payer: BLUE CROSS/BLUE SHIELD | Admitting: Physician Assistant

## 2015-08-30 VITALS — BP 122/74 | HR 88 | Temp 98.0°F | Resp 16 | Ht 75.0 in | Wt 239.0 lb

## 2015-08-30 DIAGNOSIS — G43A0 Cyclical vomiting, not intractable: Secondary | ICD-10-CM | POA: Diagnosis not present

## 2015-08-30 DIAGNOSIS — L729 Follicular cyst of the skin and subcutaneous tissue, unspecified: Secondary | ICD-10-CM

## 2015-08-30 MED ORDER — ONDANSETRON 4 MG PO TBDP: 4.0000 mg | ORAL_TABLET | Freq: Three times a day (TID) | ORAL | 0 refills | Status: DC | PRN

## 2015-08-30 MED ORDER — ONDANSETRON 4 MG PO TBDP
4.0000 mg | ORAL_TABLET | Freq: Once | ORAL | Status: AC
  Administered 2015-08-30: 4 mg via ORAL

## 2015-08-30 MED ORDER — MUPIROCIN 2 % EX OINT: 1.0000 "application " | TOPICAL_OINTMENT | Freq: Three times a day (TID) | CUTANEOUS | 1 refills | Status: DC

## 2015-08-30 NOTE — Patient Instructions (Addendum)
WOUND CARE Please return in 10 days to have your stitches/staples removed or sooner if you have concerns. . Keep area clean and dry for 24 hours. Do not remove bandage, if applied. . After 24 hours, remove bandage and wash wound gently with mild soap and warm water. Reapply a new bandage after cleaning wound, if directed. . Continue daily cleansing with soap and water until stitches/staples are removed. . Do not apply any ointments or creams to the wound while stitches/staples are in place, as this may cause delayed healing. . Notify the office if you experience any of the following signs of infection: Swelling, redness, pus drainage, streaking, fever >101.0 F . Notify the office if you experience excessive bleeding that does not stop after 15-20 minutes of constant, firm pressure.      IF you received an x-ray today, you will receive an invoice from [facility]. Please contact [facility] at [phone] with questions or concerns regarding your invoice.   IF you received labwork today, you will receive an invoice from [facility]. Please contact [facility] at [phone] with questions or concerns regarding your invoice.   Our billing staff will not be able to assist you with questions regarding bills from these companies.  You will be contacted with the lab results as soon as they are available. The fastest way to get your results is to activate your My Chart account. Instructions are located on the last page of this paperwork. If you have not heard from us regarding the results in 2 weeks, please contact this office.      
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
## 2015-08-31 ENCOUNTER — Telehealth: Payer: Self-pay

## 2015-08-31 NOTE — Telephone Encounter (Signed)
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

## 2015-09-01 ENCOUNTER — Encounter: Payer: Self-pay | Admitting: Physician Assistant

## 2015-09-03 ENCOUNTER — Ambulatory Visit: Payer: BLUE CROSS/BLUE SHIELD

## 2015-09-05 ENCOUNTER — Ambulatory Visit (INDEPENDENT_AMBULATORY_CARE_PROVIDER_SITE_OTHER): Payer: BLUE CROSS/BLUE SHIELD | Admitting: Urgent Care

## 2015-09-05 ENCOUNTER — Encounter: Payer: Self-pay | Admitting: Urgent Care

## 2015-09-05 VITALS — BP 112/80 | HR 68 | Temp 97.9°F | Resp 18 | Ht 75.0 in | Wt 242.4 lb

## 2015-09-05 DIAGNOSIS — L729 Follicular cyst of the skin and subcutaneous tissue, unspecified: Secondary | ICD-10-CM

## 2015-09-05 DIAGNOSIS — R42 Dizziness and giddiness: Secondary | ICD-10-CM
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
## 2015-09-05 NOTE — Patient Instructions (Addendum)
Dizziness Dizziness is a common problem. It is a feeling of unsteadiness or light-headedness. You may feel like you are about to faint. Dizziness can lead to injury if you stumble or fall. Anyone can become dizzy, but dizziness is more common in older adults. This condition can be caused by a number of things, including medicines, dehydration, or illness. HOME CARE INSTRUCTIONS Taking these steps may help with your condition: Eating and Drinking  Drink enough fluid to keep your urine clear or pale yellow. This helps to keep you from becoming dehydrated. Try to drink more clear fluids, such as water.  Do not drink alcohol.  Limit your caffeine intake if directed by your health care provider.  Limit your salt intake if directed by your health care provider. Activity  Avoid making quick movements.  Rise slowly from chairs and steady yourself until you feel okay.  In the morning, first sit up on the side of the bed. When you feel okay, stand slowly while you hold onto something until you know that your balance is fine.  Move your legs often if you need to stand in one place for a long time. Tighten and relax your muscles in your legs while you are standing.  Do not drive or operate heavy machinery if you feel dizzy.  Avoid bending down if you feel dizzy. Place items in your home so that they are easy for you to reach without leaning over. Lifestyle  Do not use any tobacco products, including cigarettes, chewing tobacco, or electronic cigarettes. If you need help quitting, ask your health care provider.  Try to reduce your stress level, such as with yoga or meditation. Talk with your health care provider if you need help. General Instructions  Watch your dizziness for any changes.  Take medicines only as directed by your health care provider. Talk with your health care provider if you think that your dizziness is caused by a medicine that you are taking.  Tell a friend or a family  member that you are feeling dizzy. If he or she notices any changes in your behavior, have this person call your health care provider.  Keep all follow-up visits as directed by your health care provider. This is important. SEEK MEDICAL CARE IF:  Your dizziness does not go away.  Your dizziness or light-headedness gets worse.  You feel nauseous.  You have reduced hearing.  You have new symptoms.  You are unsteady on your feet or you feel like the room is spinning. SEEK IMMEDIATE MEDICAL CARE IF:  You vomit or have diarrhea and are unable to eat or drink anything.  You have problems talking, walking, swallowing, or using your arms, hands, or legs.  You feel generally weak.  You are not thinking clearly or you have trouble forming sentences. It may take a friend or family member to notice this.  You have chest pain, abdominal pain, shortness of breath, or sweating.  Your vision changes.  You notice any bleeding.  You have a headache.  You have neck pain or a stiff neck.  You have a fever.   This information is not intended to replace advice given to you by your health care provider. Make sure you discuss any questions you have with your health care provider.   Document Released: 06/27/2000 Document Revised: 05/18/2014 Document Reviewed: 12/28/2013 Elsevier Interactive Patient Education 2016 [employer]     IF you received an x-ray today, you will receive an invoice from [facility]. Please  contact [facility] at [phone] with questions or concerns regarding your invoice.   IF you received labwork today, you will receive an invoice from [facility]. Please contact [facility] at [phone] with questions or concerns regarding your invoice.   Our billing staff will not be able to assist you with questions regarding bills from these companies.  You will be contacted with the lab results as soon as they are available. The  fastest way to get your results is to activate your My Chart account. Instructions are located on the last page of this paperwork. If you have not heard from us regarding the results in 2 weeks, please contact this office.     

## 2015-09-27 ENCOUNTER — Ambulatory Visit (INDEPENDENT_AMBULATORY_CARE_PROVIDER_SITE_OTHER): Payer: BLUE CROSS/BLUE SHIELD | Admitting: Family Medicine

## 2015-09-27 VITALS — BP 116/76 | HR 94 | Temp 98.1°F | Resp 17 | Ht 75.0 in | Wt 244.0 lb

## 2015-09-27 DIAGNOSIS — L723 Sebaceous cyst: Secondary | ICD-10-CM

## 2015-09-27 NOTE — Patient Instructions (Signed)
     IF you received an x-ray today, you will receive an invoice from [facility]. Please contact [facility] at [phone] with questions or concerns regarding your invoice.   IF you received labwork today, you will receive an invoice from [facility]. Please contact [facility] at [phone] with questions or concerns regarding your invoice.   Our billing staff will not be able to assist you with questions regarding bills from these companies.  You will be contacted with the lab results as soon as they are available. The fastest way to get your results is to activate your My Chart account. Instructions are located on the last page of this paperwork. If you have not heard from us regarding the results in 2 weeks, please contact this office.      
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
## 2016-01-02 ENCOUNTER — Emergency Department (HOSPITAL_BASED_OUTPATIENT_CLINIC_OR_DEPARTMENT_OTHER)
Admission: EM | Admit: 2016-01-02 | Discharge: 2016-01-02 | Disposition: A | Discharge status: Home / Self Care | Payer: BLUE CROSS/BLUE SHIELD | Attending: Emergency Medicine | Admitting: Emergency Medicine

## 2016-01-02 ENCOUNTER — Encounter (HOSPITAL_BASED_OUTPATIENT_CLINIC_OR_DEPARTMENT_OTHER): Payer: Self-pay | Admitting: *Deleted

## 2016-01-02 DIAGNOSIS — F1290 Cannabis use, unspecified, uncomplicated: Secondary | ICD-10-CM | POA: Insufficient documentation

## 2016-01-02 DIAGNOSIS — J111 Influenza due to unidentified influenza virus with other respiratory manifestations: Secondary | ICD-10-CM | POA: Insufficient documentation

## 2016-01-02 DIAGNOSIS — R69 Illness, unspecified: Secondary | ICD-10-CM

## 2016-01-02 DIAGNOSIS — F17200 Nicotine dependence, unspecified, uncomplicated: Secondary | ICD-10-CM | POA: Insufficient documentation

## 2016-01-02 MED ORDER — IBUPROFEN 800 MG PO TABS
800.0000 mg | ORAL_TABLET | Freq: Three times a day (TID) | ORAL | 0 refills | Status: DC
Start: 2016-01-02 — End: 2016-12-29

## 2016-01-02 MED ORDER — HYDROCODONE-HOMATROPINE 5-1.5 MG/5ML PO SYRP: 5.0000 mL | ORAL_SOLUTION | Freq: Four times a day (QID) | ORAL | 0 refills | Status: DC | PRN

## 2016-01-02 MED ORDER — OSELTAMIVIR PHOSPHATE 75 MG PO CAPS
75.0000 mg | ORAL_CAPSULE | Freq: Once | ORAL | Status: AC
  Administered 2016-01-02: 75 mg via ORAL
  Filled 2016-01-02: qty 1

## 2016-01-02 MED ORDER — OSELTAMIVIR PHOSPHATE 75 MG PO CAPS: 75.0000 mg | ORAL_CAPSULE | Freq: Two times a day (BID) | ORAL | 0 refills | Status: DC

## 2016-01-02 MED ORDER — BENZONATATE 100 MG PO CAPS: 100.0000 mg | ORAL_CAPSULE | Freq: Three times a day (TID) | ORAL | 0 refills | Status: DC | PRN

## 2016-01-02 NOTE — ED Notes (Signed)
C/o cough and fever, onset today, also HA and generalized body aches and back spasms (especially with coughing), no meds for sx PTA, highest fever 101, describes cough as sometimes productive(clear), (denies: nvd, dizziness, sore throat or other sx). Family in w/r.   Pt alert, NAD, calm, easily provoked cough, no dyspnea noted, skin W&D, speaking in clear complete sentences, steady gait.
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
## 2016-01-02 NOTE — ED Notes (Signed)
EDP into room 

## 2016-08-01 ENCOUNTER — Emergency Department (HOSPITAL_COMMUNITY)
Admission: EM | Admit: 2016-08-01 | Discharge: 2016-08-01 | Disposition: A | Discharge status: Home / Self Care | Payer: BLUE CROSS/BLUE SHIELD | Attending: Emergency Medicine | Admitting: Emergency Medicine

## 2016-08-01 ENCOUNTER — Encounter (HOSPITAL_COMMUNITY): Payer: Self-pay | Admitting: Emergency Medicine

## 2016-08-01 DIAGNOSIS — F17200 Nicotine dependence, unspecified, uncomplicated: Secondary | ICD-10-CM | POA: Insufficient documentation

## 2016-08-01 DIAGNOSIS — H6502 Acute serous otitis media, left ear: Secondary | ICD-10-CM | POA: Insufficient documentation

## 2016-08-01 MED ORDER — AMOXICILLIN 500 MG PO CAPS: 500.0000 mg | ORAL_CAPSULE | Freq: Three times a day (TID) | ORAL | 0 refills | Status: DC

## 2016-08-01 NOTE — ED Triage Notes (Signed)
Reports pain in left ear that started today.  Denies having any drainage.  Reports that pain is into the jaw on the left side as well.  No fevers.  Has not taken anything for pain.
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
## 2016-09-21 ENCOUNTER — Emergency Department (HOSPITAL_COMMUNITY): Payer: Self-pay

## 2016-09-21 ENCOUNTER — Emergency Department (HOSPITAL_COMMUNITY)
Admission: EM | Admit: 2016-09-21 | Discharge: 2016-09-21 | Disposition: A | Discharge status: Home / Self Care | Payer: Self-pay | Attending: Emergency Medicine | Admitting: Emergency Medicine

## 2016-09-21 ENCOUNTER — Encounter (HOSPITAL_COMMUNITY): Payer: Self-pay

## 2016-09-21 DIAGNOSIS — M79641 Pain in right hand: Secondary | ICD-10-CM | POA: Insufficient documentation

## 2016-09-21 DIAGNOSIS — F17210 Nicotine dependence, cigarettes, uncomplicated: Secondary | ICD-10-CM | POA: Insufficient documentation

## 2016-09-21 NOTE — ED Triage Notes (Signed)
Pt endorses a knot on his right ring finger x 1 month, then yesterday while working the same finger got caught in a conveyor belt. Pt unable to make fish with finger. CMS intact.
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
## 2016-09-21 NOTE — Discharge Instructions (Signed)
Please read attached information regarding your condition. Use finger splint as directed. Take ibuprofen or Aleve as needed for pain and inflammation. Follow-up with hand specialist listed below for further evaluation if symptoms persist. Return to ED for worsening pain, additional injury, increased swelling, trouble moving finger, signs of severe infection.

## 2016-12-29 ENCOUNTER — Encounter (HOSPITAL_COMMUNITY): Payer: Self-pay

## 2016-12-29 ENCOUNTER — Other Ambulatory Visit: Payer: Self-pay

## 2016-12-29 ENCOUNTER — Emergency Department (HOSPITAL_COMMUNITY): Payer: Self-pay

## 2016-12-29 ENCOUNTER — Emergency Department (HOSPITAL_COMMUNITY)
Admission: EM | Admit: 2016-12-29 | Discharge: 2016-12-29 | Disposition: A | Discharge status: Home / Self Care | Payer: Self-pay | Attending: Emergency Medicine | Admitting: Emergency Medicine

## 2016-12-29 DIAGNOSIS — Y929 Unspecified place or not applicable: Secondary | ICD-10-CM | POA: Insufficient documentation

## 2016-12-29 DIAGNOSIS — Y939 Activity, unspecified: Secondary | ICD-10-CM | POA: Insufficient documentation

## 2016-12-29 DIAGNOSIS — Z23 Encounter for immunization: Secondary | ICD-10-CM | POA: Insufficient documentation

## 2016-12-29 DIAGNOSIS — Y990 Civilian activity done for income or pay: Secondary | ICD-10-CM | POA: Insufficient documentation

## 2016-12-29 DIAGNOSIS — S61211A Laceration without foreign body of left index finger without damage to nail, initial encounter: Secondary | ICD-10-CM | POA: Insufficient documentation

## 2016-12-29 DIAGNOSIS — F17210 Nicotine dependence, cigarettes, uncomplicated: Secondary | ICD-10-CM | POA: Insufficient documentation

## 2016-12-29 DIAGNOSIS — W312XXA Contact with powered woodworking and forming machines, initial encounter: Secondary | ICD-10-CM | POA: Insufficient documentation

## 2016-12-29 MED ORDER — LIDOCAINE HCL (PF) 1 % IJ SOLN
5.0000 mL | Freq: Once | INTRAMUSCULAR | Status: AC
  Administered 2016-12-29: 5 mL
  Filled 2016-12-29: qty 5

## 2016-12-29 MED ORDER — CEPHALEXIN 500 MG PO CAPS: 500.0000 mg | ORAL_CAPSULE | Freq: Four times a day (QID) | ORAL | 0 refills | Status: DC

## 2016-12-29 MED ORDER — IBUPROFEN 600 MG PO TABS: 600.0000 mg | ORAL_TABLET | Freq: Four times a day (QID) | ORAL | 0 refills | Status: DC | PRN

## 2016-12-29 MED ORDER — TETANUS-DIPHTH-ACELL PERTUSSIS 5-2.5-18.5 LF-MCG/0.5 IM SUSP
0.5000 mL | Freq: Once | INTRAMUSCULAR | Status: AC
  Administered 2016-12-29: 0.5 mL via INTRAMUSCULAR
  Filled 2016-12-29: qty 0.5

## 2016-12-29 MED ORDER — BUPIVACAINE HCL 0.25 % IJ SOLN
10.0000 mL | Freq: Once | INTRAMUSCULAR | Status: AC
  Administered 2016-12-29: 10 mL
  Filled 2016-12-29: qty 10

## 2016-12-29 NOTE — ED Triage Notes (Signed)
Pt reports he needs a tetanus vac.

## 2016-12-29 NOTE — Discharge Instructions (Signed)
Please see the information and instructions below regarding your visit.  Your diagnoses today include:  1. Laceration of left index finger without foreign body without damage to nail, initial encounter     Tests performed today include: X-ray of the affected area that did not show any foreign bodies or broken bones Vital signs. See below for your results today.   Medications prescribed:   Take any prescribed medications only as directed.  Ibuprofen alternating with Tylenol for pain.   Home care instructions:  Follow any educational materials and wound care instructions contained in this packet.   Keep affected area above the level of your heart when possible to minimize swelling. Wash area gently twice a day with warm soapy water. Do not apply alcohol or hydrogen peroxide directly over a wound. Cover the area if it is draining or weeping. Keep the bandage in place for 24 hours and refrain from getting the wound wet for 24 hours. After that, you may get the area wet, but please ensure that you dry it completely afterwards.  Please refrain from soaking sutures for long periods of time, or swimming in chlorinated water   You may apply antibiotic ointment such as Bacitracin or Neosporin.  Follow-up instructions: Suture Removal: Return to the Emergency Department or see your primary care care doctor in 7 days for a recheck of your wound and removal of your sutures or staples.    Please follow-up with Dr. Amanda PeaGramig in hand surgery first thing Monday morning.  Return instructions:  Return to the Emergency Department if you have: Fever Worsening pain Worsening swelling of the wound Pus draining from the wound Redness of the skin that moves away from the wound, especially if it streaks away from the affected area  Any other emergent concerns  Your vital signs today were: BP 138/74 (BP Location: Right Arm)    Pulse 67    Temp 98.7 F (37.1 C) (Oral)    Resp 16    Ht 6\' 3"  (1.905 m)    Wt  108.9 kg (240 lb)    SpO2 99%    BMI 30.00 kg/m  If your blood pressure (BP) was elevated on multiple readings during this visit above 130 for the top number or above 80 for the bottom number, please have this repeated by your primary care provider within one month. --------------  Thank you for allowing us to participate in your care today! It was a pleasure taking care of you.
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
## 2016-12-29 NOTE — ED Notes (Signed)
Declined W/C at D/C and was escorted to lobby by RN. 

## 2016-12-29 NOTE — ED Triage Notes (Signed)
Patient here with small laceration to side of index finger, no bleeding

## 2017-01-07 ENCOUNTER — Emergency Department (HOSPITAL_COMMUNITY)
Admission: EM | Admit: 2017-01-07 | Discharge: 2017-01-07 | Disposition: A | Discharge status: Home / Self Care | Payer: BLUE CROSS/BLUE SHIELD | Attending: Emergency Medicine | Admitting: Emergency Medicine

## 2017-01-07 ENCOUNTER — Encounter (HOSPITAL_COMMUNITY): Payer: Self-pay | Admitting: Neurology

## 2017-01-07 DIAGNOSIS — Z4802 Encounter for removal of sutures: Secondary | ICD-10-CM | POA: Insufficient documentation

## 2017-01-07 DIAGNOSIS — F17210 Nicotine dependence, cigarettes, uncomplicated: Secondary | ICD-10-CM | POA: Insufficient documentation

## 2017-01-07 DIAGNOSIS — Z79899 Other long term (current) drug therapy: Secondary | ICD-10-CM | POA: Insufficient documentation

## 2017-01-07 NOTE — ED Triage Notes (Signed)
Pt has 2 sutures placed to his left index finger last week, is here to have them removed.
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
## 2017-01-07 NOTE — Discharge Instructions (Signed)
Please keep your finger clean and dry.  Please use antibiotic ointment and keep your finger covered for the next few days.  Please do not soak or submerge your finger in water for 24 hours after suture (stitches) removal.

## 2017-01-23 ENCOUNTER — Encounter (HOSPITAL_COMMUNITY): Payer: Self-pay | Admitting: *Deleted

## 2017-01-23 ENCOUNTER — Emergency Department (HOSPITAL_COMMUNITY)
Admission: EM | Admit: 2017-01-23 | Discharge: 2017-01-23 | Disposition: A | Discharge status: Home / Self Care | Payer: BLUE CROSS/BLUE SHIELD | Attending: Emergency Medicine | Admitting: Emergency Medicine

## 2017-01-23 DIAGNOSIS — B349 Viral infection, unspecified: Secondary | ICD-10-CM | POA: Insufficient documentation

## 2017-01-23 DIAGNOSIS — J029 Acute pharyngitis, unspecified: Secondary | ICD-10-CM | POA: Insufficient documentation

## 2017-01-23 DIAGNOSIS — F17210 Nicotine dependence, cigarettes, uncomplicated: Secondary | ICD-10-CM | POA: Insufficient documentation

## 2017-01-23 LAB — RAPID STREP SCREEN (MED CTR MEBANE ONLY): STREPTOCOCCUS, GROUP A SCREEN (DIRECT): NEGATIVE

## 2017-01-23 MED ORDER — LIDOCAINE VISCOUS 2 % MT SOLN: 15.0000 mL | OROMUCOSAL | 0 refills | Status: DC | PRN

## 2017-01-23 MED ORDER — IBUPROFEN 400 MG PO TABS
600.0000 mg | ORAL_TABLET | Freq: Once | ORAL | Status: AC
  Administered 2017-01-23: 600 mg via ORAL
  Filled 2017-01-23: qty 1

## 2017-01-23 MED ORDER — IBUPROFEN 600 MG PO TABS: 600.0000 mg | ORAL_TABLET | Freq: Four times a day (QID) | ORAL | 0 refills | Status: DC | PRN

## 2017-01-23 MED ORDER — PREDNISONE 10 MG PO TABS: 20.0000 mg | ORAL_TABLET | Freq: Two times a day (BID) | ORAL | 0 refills | Status: DC

## 2017-01-23 MED ORDER — PREDNISONE 20 MG PO TABS
60.0000 mg | ORAL_TABLET | Freq: Once | ORAL | Status: AC
  Administered 2017-01-23: 60 mg via ORAL
  Filled 2017-01-23: qty 3
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
## 2017-01-23 NOTE — Discharge Instructions (Signed)
Please read and follow all provided instructions.  Your diagnoses today include: Viral Pharyngitis   Tests performed today include: Vital signs. See below for your results today.  Strep Test: This was negative. A throat culture was sent as a precaution and results will be available in 2-3 days. If it returns positive for strep, you will be called by our flow manager for further instructions.  Medications prescribed:  1. Take as prescribed.   Home care instructions:  At this time, it appears that your sore throat is caused by a viral infection. Antibiotics do NOT help a viral infection and can cause unwanted side effects. The fever should resolve in 2-3 days and sore throat should begin to resolve in 2-3 days as well. Continued to alternate between Tylenol and ibuprofen for pain. May consider over-the-counter Benadryl for additional relief (decrease secretions). Also discard your toothbrush and begin using a new one in 3 days.   Follow-up instructions: Please follow-up with your primary care provider in 2-3 days for follow up.   Return instructions:  Return to the ED sooner for worsening condition, inability to swallow, breathing difficulty, new concerns.  Additional Information:  Your vital signs today were: BP 123/75 (BP Location: Right Arm)    Pulse 92    Temp 98.4 F (36.9 C) (Oral)    Resp 18    SpO2 98%  If your blood pressure (BP) was elevated above 135/85 this visit, please have this repeated by your doctor within one month. ---------------

## 2017-01-23 NOTE — ED Triage Notes (Signed)
To ED for eval of sore throat and fever for past few days. No airway compromise noted. Redness visualized with white patches in throat. Pt able to hold secretions.

## 2017-01-25 LAB — CULTURE, GROUP A STREP (THRC)

## 2017-06-06 ENCOUNTER — Encounter: Payer: Self-pay | Admitting: Family Medicine

## 2017-06-19 ENCOUNTER — Ambulatory Visit (HOSPITAL_COMMUNITY)
Admission: EM | Admit: 2017-06-19 | Discharge: 2017-06-19 | Disposition: A | Discharge status: Home / Self Care | Payer: PRIVATE HEALTH INSURANCE | Attending: Internal Medicine | Admitting: Internal Medicine

## 2017-06-19 ENCOUNTER — Encounter (HOSPITAL_COMMUNITY): Payer: Self-pay | Admitting: Emergency Medicine

## 2017-06-19 DIAGNOSIS — M25561 Pain in right knee: Secondary | ICD-10-CM

## 2017-06-19 DIAGNOSIS — M79604 Pain in right leg: Secondary | ICD-10-CM | POA: Diagnosis not present

## 2017-06-19 DIAGNOSIS — M79605 Pain in left leg: Secondary | ICD-10-CM | POA: Diagnosis not present

## 2017-06-19 DIAGNOSIS — G8929 Other chronic pain: Secondary | ICD-10-CM | POA: Diagnosis not present

## 2017-06-19 MED ORDER — MELOXICAM 10 MG PO CAPS: 10.0000 mg | ORAL_CAPSULE | Freq: Every day | ORAL | 0 refills | Status: DC

## 2017-06-19 NOTE — Discharge Instructions (Signed)
Stretch your legs out daily before and after work. Please wear compression stockings, especially while working.  Right knee sleeve while working for support. Meloxciam once a day, take with food, do not take with additional ibuprofen. Please try to decrease to quit smoking. Please establish with a primary care provider for follow up.
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
## 2017-06-19 NOTE — ED Triage Notes (Signed)
Pt sts bilateral leg pain chronic in nature worse with standing

## 2017-09-07 ENCOUNTER — Other Ambulatory Visit: Payer: Self-pay

## 2017-09-07 ENCOUNTER — Emergency Department (HOSPITAL_COMMUNITY): Payer: PRIVATE HEALTH INSURANCE

## 2017-09-07 ENCOUNTER — Encounter (HOSPITAL_COMMUNITY): Payer: Self-pay | Admitting: Emergency Medicine

## 2017-09-07 ENCOUNTER — Emergency Department (HOSPITAL_COMMUNITY)
Admission: EM | Admit: 2017-09-07 | Discharge: 2017-09-07 | Disposition: A | Discharge status: Home / Self Care | Payer: PRIVATE HEALTH INSURANCE | Attending: Emergency Medicine | Admitting: Emergency Medicine

## 2017-09-07 DIAGNOSIS — Y929 Unspecified place or not applicable: Secondary | ICD-10-CM | POA: Insufficient documentation

## 2017-09-07 DIAGNOSIS — F17210 Nicotine dependence, cigarettes, uncomplicated: Secondary | ICD-10-CM | POA: Insufficient documentation

## 2017-09-07 DIAGNOSIS — Y999 Unspecified external cause status: Secondary | ICD-10-CM | POA: Insufficient documentation

## 2017-09-07 DIAGNOSIS — M79671 Pain in right foot: Secondary | ICD-10-CM | POA: Insufficient documentation

## 2017-09-07 DIAGNOSIS — Y939 Activity, unspecified: Secondary | ICD-10-CM | POA: Insufficient documentation

## 2017-09-07 DIAGNOSIS — W231XXA Caught, crushed, jammed, or pinched between stationary objects, initial encounter: Secondary | ICD-10-CM | POA: Insufficient documentation

## 2017-09-07 DIAGNOSIS — Z79899 Other long term (current) drug therapy: Secondary | ICD-10-CM | POA: Insufficient documentation

## 2017-09-07 MED ORDER — IBUPROFEN 600 MG PO TABS: 600.0000 mg | ORAL_TABLET | Freq: Four times a day (QID) | ORAL | 0 refills | Status: DC | PRN

## 2017-09-07 NOTE — Discharge Instructions (Addendum)
Please read and follow all provided instructions.  You have been seen today for right foot pain  Tests performed today include: An x-ray of the affected area - does NOT show any broken bones or dislocations.  Vital signs. See below for your results today.   Home care instructions: -- *PRICE in the first 24-48 hours after injury: Protect (with brace, splint, sling), if given by your provider Rest Ice- Do not apply ice pack directly to your skin, place towel or similar between your skin and ice/ice pack. Apply ice for 20 min, then remove for 40 min while awake Compression- Wear brace, elastic bandage, splint as directed by your provider Elevate affected extremity above the level of your heart when not walking around for the first 24-48 hours   Use Ibuprofen (Motrin/Advil) 600mg  every 6 hours as needed for pain (do not exceed max dose in 24 hours, 2400mg )  Follow-up instructions: Please follow-up with your primary care provider if you continue to have significant pain in 1 week. In this case you may have a more severe injury that requires further care.   Return instructions:  Please return if your toes or feet are numb or tingling, appear gray or blue, or you have severe pain (also elevate the leg and loosen splint or wrap if you were given one) Please return to the Emergency Department if you experience worsening symptoms.  Please return if you have any other emergent concerns. Additional Information:  Your vital signs today were: BP 126/83 (BP Location: Right Arm)    Pulse 70    Temp 98 F (36.7 C) (Oral)    Resp 19    Ht 6\' 3"  (1.905 m)    Wt 113.9 kg    SpO2 96%    BMI 31.37 kg/m  If your blood pressure (BP) was elevated above 135/85 this visit, please have this repeated by your doctor within one month. ---------------
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
## 2017-09-07 NOTE — ED Notes (Signed)
Ortho tech called for cam walker
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
## 2017-09-24 ENCOUNTER — Other Ambulatory Visit: Payer: Self-pay

## 2017-09-24 ENCOUNTER — Encounter (HOSPITAL_COMMUNITY): Payer: Self-pay | Admitting: Emergency Medicine

## 2017-09-24 ENCOUNTER — Emergency Department (HOSPITAL_COMMUNITY)
Admission: EM | Admit: 2017-09-24 | Discharge: 2017-09-24 | Disposition: A | Discharge status: Home / Self Care | Payer: Self-pay | Attending: Emergency Medicine | Admitting: Emergency Medicine

## 2017-09-24 ENCOUNTER — Emergency Department (HOSPITAL_COMMUNITY): Payer: Self-pay

## 2017-09-24 DIAGNOSIS — R1111 Vomiting without nausea: Secondary | ICD-10-CM

## 2017-09-24 DIAGNOSIS — R112 Nausea with vomiting, unspecified: Secondary | ICD-10-CM | POA: Insufficient documentation

## 2017-09-24 DIAGNOSIS — F17210 Nicotine dependence, cigarettes, uncomplicated: Secondary | ICD-10-CM | POA: Insufficient documentation

## 2017-09-24 DIAGNOSIS — Z79899 Other long term (current) drug therapy: Secondary | ICD-10-CM | POA: Insufficient documentation

## 2017-09-24 DIAGNOSIS — R1013 Epigastric pain: Secondary | ICD-10-CM | POA: Insufficient documentation

## 2017-09-24 LAB — COMPREHENSIVE METABOLIC PANEL
ALT: 34 U/L (ref 0–44)
AST: 27 U/L (ref 15–41)
Albumin: 4.1 g/dL (ref 3.5–5.0)
Alkaline Phosphatase: 96 U/L (ref 38–126)
Anion gap: 6 (ref 5–15)
BUN: 9 mg/dL (ref 6–20)
CHLORIDE: 106 mmol/L (ref 98–111)
CO2: 30 mmol/L (ref 22–32)
CREATININE: 0.98 mg/dL (ref 0.61–1.24)
Calcium: 8.9 mg/dL (ref 8.9–10.3)
GFR calc Af Amer: >60 mL/min (ref >60)
Glucose, Bld: 117 mg/dL — ABNORMAL HIGH (ref 70–99)
Potassium: 3.8 mmol/L (ref 3.5–5.1)
SODIUM: 142 mmol/L (ref 135–145)
Total Bilirubin: 0.9 mg/dL (ref 0.3–1.2)
Total Protein: 7.5 g/dL (ref 6.5–8.1)

## 2017-09-24 LAB — CBC
HCT: 43 % (ref 39.0–52.0)
Hemoglobin: 14.1 g/dL (ref 13.0–17.0)
MCH: 30.9 pg (ref 26.0–34.0)
MCHC: 32.8 g/dL (ref 30.0–36.0)
MCV: 94.1 fL (ref 78.0–100.0)
PLATELETS: 275 10*3/uL (ref 150–400)
RBC: 4.57 MIL/uL (ref 4.22–5.81)
RDW: 13.5 % (ref 11.5–15.5)
WBC: 5.7 10*3/uL (ref 4.0–10.5)

## 2017-09-24 LAB — URINALYSIS, ROUTINE W REFLEX MICROSCOPIC
Bilirubin Urine: NEGATIVE
GLUCOSE, UA: 50 mg/dL — AB
HGB URINE DIPSTICK: NEGATIVE
KETONES UR: NEGATIVE mg/dL
Leukocytes, UA: NEGATIVE
Nitrite: NEGATIVE
PROTEIN: NEGATIVE mg/dL
Specific Gravity, Urine: 1.014 (ref 1.005–1.030)
pH: 7 (ref 5.0–8.0)

## 2017-09-24 LAB — LIPASE, BLOOD: LIPASE: 48 U/L (ref 11–51)

## 2017-09-24 MED ORDER — SODIUM CHLORIDE 0.9 % IV BOLUS
1000.0000 mL | Freq: Once | INTRAVENOUS | Status: AC
  Administered 2017-09-24: 1000 mL via INTRAVENOUS

## 2017-09-24 MED ORDER — SUCRALFATE 1 G PO TABS: 1.0000 g | ORAL_TABLET | Freq: Three times a day (TID) | ORAL | 0 refills | Status: DC

## 2017-09-24 MED ORDER — IOPAMIDOL (ISOVUE-300) INJECTION 61%
100.0000 mL | Freq: Once | INTRAVENOUS | Status: AC | PRN
  Administered 2017-09-24: 100 mL via INTRAVENOUS

## 2017-09-24 MED ORDER — OMEPRAZOLE 20 MG PO CPDR: 20.0000 mg | DELAYED_RELEASE_CAPSULE | Freq: Every day | ORAL | 0 refills | Status: DC

## 2017-09-24 MED ORDER — IOPAMIDOL (ISOVUE-300) INJECTION 61%
INTRAVENOUS | Status: AC
  Filled 2017-09-24: qty 100

## 2017-09-24 MED ORDER — GI COCKTAIL ~~LOC~~
30.0000 mL | Freq: Once | ORAL | Status: AC
  Administered 2017-09-24: 30 mL via ORAL
  Filled 2017-09-24: qty 30
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
## 2017-09-24 NOTE — Discharge Instructions (Addendum)
You were evaluated today for abdominal pain.  The results of your lab work, urine and CT scan were negative. You most likely have gastritis. I have prescribed you two medications. Please take as prescribed. Follow-up with your primary care provider or return to the Ed with any new or concerning symptoms such as: Contact a health care provider if: Your problems get worse. Your problems go away and then come back.  Get help right away if: You throw up blood or something that looks like coffee grounds. You have black or dark red poop (stools). You cannot keep fluids down. Your stomach pain gets worse. You have a fever. You do not feel better after 1 week.

## 2017-09-24 NOTE — ED Triage Notes (Signed)
Pt reports intermittent epigastric pains that been going on for 2 days. Pt reports yesterday at work vomited after eating and had to leave work. Reports vomiting once this morning,. Denies any bowel or urinary problems.

## 2017-09-24 NOTE — ED Notes (Signed)
Pt to CT

## 2017-10-20 ENCOUNTER — Encounter (HOSPITAL_COMMUNITY): Payer: Self-pay | Admitting: Emergency Medicine

## 2017-10-20 ENCOUNTER — Emergency Department (HOSPITAL_COMMUNITY)
Admission: EM | Admit: 2017-10-20 | Discharge: 2017-10-20 | Disposition: A | Discharge status: Home / Self Care | Payer: Self-pay | Attending: Emergency Medicine | Admitting: Emergency Medicine

## 2017-10-20 DIAGNOSIS — Z79899 Other long term (current) drug therapy: Secondary | ICD-10-CM | POA: Insufficient documentation

## 2017-10-20 DIAGNOSIS — F17210 Nicotine dependence, cigarettes, uncomplicated: Secondary | ICD-10-CM | POA: Insufficient documentation

## 2017-10-20 DIAGNOSIS — J069 Acute upper respiratory infection, unspecified: Secondary | ICD-10-CM | POA: Insufficient documentation

## 2017-10-20 NOTE — ED Triage Notes (Addendum)
Pt reports sinus congestion and dry cough x2 days.denies fevers, chills or sore throat. Has taken tylenol for his sinus pain
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
## 2017-10-20 NOTE — Discharge Instructions (Signed)
Please read instructions below.  You can take tylenol or ibuprofen as needed for sore throat or fever.  Drink plenty of water.  Use saline nasal spray for congestion. Follow up with your primary care provider as needed.  Return to the ER for inability to swallow liquids, difficulty breathing, or new or worsening symptoms.  

## 2017-11-01 ENCOUNTER — Encounter (HOSPITAL_COMMUNITY): Payer: Self-pay | Admitting: *Deleted

## 2017-11-01 ENCOUNTER — Other Ambulatory Visit: Payer: Self-pay

## 2017-11-01 ENCOUNTER — Emergency Department (HOSPITAL_COMMUNITY): Payer: Self-pay

## 2017-11-01 DIAGNOSIS — F17210 Nicotine dependence, cigarettes, uncomplicated: Secondary | ICD-10-CM | POA: Insufficient documentation

## 2017-11-01 DIAGNOSIS — Y929 Unspecified place or not applicable: Secondary | ICD-10-CM | POA: Insufficient documentation

## 2017-11-01 DIAGNOSIS — W1789XA Other fall from one level to another, initial encounter: Secondary | ICD-10-CM | POA: Insufficient documentation

## 2017-11-01 DIAGNOSIS — Z79899 Other long term (current) drug therapy: Secondary | ICD-10-CM | POA: Insufficient documentation

## 2017-11-01 DIAGNOSIS — Y9389 Activity, other specified: Secondary | ICD-10-CM | POA: Insufficient documentation

## 2017-11-01 DIAGNOSIS — S99912A Unspecified injury of left ankle, initial encounter: Secondary | ICD-10-CM | POA: Insufficient documentation

## 2017-11-01 DIAGNOSIS — Y999 Unspecified external cause status: Secondary | ICD-10-CM | POA: Insufficient documentation

## 2017-11-01 NOTE — ED Triage Notes (Signed)
Pt says he was at work and stepped off a pallet, c/o left ankle pain. Took ibuprofen for pain PTA.

## 2017-11-02 ENCOUNTER — Emergency Department (HOSPITAL_COMMUNITY): Payer: Self-pay

## 2017-11-02 ENCOUNTER — Emergency Department (HOSPITAL_COMMUNITY)
Admission: EM | Admit: 2017-11-02 | Discharge: 2017-11-02 | Disposition: A | Discharge status: Home / Self Care | Payer: Self-pay | Attending: Emergency Medicine | Admitting: Emergency Medicine

## 2017-11-02 ENCOUNTER — Encounter (HOSPITAL_COMMUNITY): Payer: Self-pay | Admitting: Student

## 2017-11-02 DIAGNOSIS — S99912A Unspecified injury of left ankle, initial encounter: Secondary | ICD-10-CM

## 2017-11-02 MED ORDER — NAPROXEN 375 MG PO TABS: 375.0000 mg | ORAL_TABLET | Freq: Two times a day (BID) | ORAL | 0 refills | Status: DC

## 2017-11-02 NOTE — Discharge Instructions (Signed)
Please read and follow all provided instructions.  You have been seen today for a left ankle injury  Tests performed today include: An x-ray of the affected area - does NOT show any broken bones or dislocations.  Vital signs. See below for your results today.   Home care instructions: -- *PRICE in the first 24-48 hours after injury: Protect (with brace, splint, sling), if given by your provider Rest Ice- Do not apply ice pack directly to your skin, place towel or similar between your skin and ice/ice pack. Apply ice for 20 min, then remove for 40 min while awake Compression- Wear brace, elastic bandage, splint as directed by your provider Elevate affected extremity above the level of your heart when not walking around for the first 24-48 hours   Medications:  - Naproxen is a nonsteroidal anti-inflammatory medication that will help with pain and swelling. Be sure to take this medication as prescribed with food, 1 pill every 12 hours,  It should be taken with food, as it can cause stomach upset, and more seriously, stomach bleeding. Do not take other nonsteroidal anti-inflammatory medications with this such as Advil, Motrin, Aleve, Mobic, Goodie Powder, or Motrin.    You make take Tylenol per over the counter dosing with these medications.   We have prescribed you new medication(s) today. Discuss the medications prescribed today with your pharmacist as they can have adverse effects and interactions with your other medicines including over the counter and prescribed medications. Seek medical evaluation if you start to experience new or abnormal symptoms after taking one of these medicines, seek care immediately if you start to experience difficulty breathing, feeling of your throat closing, facial swelling, or rash as these could be indications of a more serious allergic reaction   Follow-up instructions: Please follow-up with your primary care provider or the provided orthopedic physician (bone  specialist) if you continue to have significant pain in 1 week. In this case you may have a more severe injury that requires further care.   Return instructions:  Please return if your digits or extremity are numb or tingling, appear gray or blue, or you have severe pain (also elevate the extremity and loosen splint or wrap if you were given one) Please return if you have redness or fevers.  Please return to the Emergency Department if you experience worsening symptoms.  Please return if you have any other emergent concerns. Additional Information:  Your vital signs today were: BP 101/70 (BP Location: Right Arm)    Pulse 69    Temp 98.2 F (36.8 C) (Oral)    Resp 18    SpO2 97%  If your blood pressure (BP) was elevated above 135/85 this visit, please have this repeated by your doctor within one month. ---------------
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
## 2017-12-14 ENCOUNTER — Encounter (HOSPITAL_COMMUNITY): Payer: Self-pay

## 2017-12-14 ENCOUNTER — Emergency Department (HOSPITAL_COMMUNITY): Payer: Self-pay

## 2017-12-14 ENCOUNTER — Other Ambulatory Visit: Payer: Self-pay

## 2017-12-14 ENCOUNTER — Emergency Department (HOSPITAL_COMMUNITY)
Admission: EM | Admit: 2017-12-14 | Discharge: 2017-12-14 | Disposition: A | Discharge status: Home / Self Care | Payer: Self-pay | Attending: Emergency Medicine | Admitting: Emergency Medicine

## 2017-12-14 DIAGNOSIS — F17210 Nicotine dependence, cigarettes, uncomplicated: Secondary | ICD-10-CM | POA: Insufficient documentation

## 2017-12-14 DIAGNOSIS — M25561 Pain in right knee: Secondary | ICD-10-CM | POA: Insufficient documentation

## 2017-12-14 MED ORDER — NAPROXEN 500 MG PO TABS: 500.0000 mg | ORAL_TABLET | Freq: Two times a day (BID) | ORAL | 0 refills | Status: DC
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
## 2017-12-14 NOTE — ED Triage Notes (Signed)
He c/o some chronic issues with his right knee being "sore a lot". He is here today with c/o pain in right knee plus swelling in right knee (which is new), ever since stepping "down kinda hard from a ladder at work Thursday" [sic]. He is ambulatory with a pronounced limp.

## 2017-12-28 ENCOUNTER — Other Ambulatory Visit: Payer: Self-pay

## 2017-12-28 ENCOUNTER — Encounter (HOSPITAL_COMMUNITY): Payer: Self-pay | Admitting: *Deleted

## 2017-12-28 ENCOUNTER — Emergency Department (HOSPITAL_COMMUNITY)
Admission: EM | Admit: 2017-12-28 | Discharge: 2017-12-28 | Disposition: A | Discharge status: Home / Self Care | Payer: Self-pay | Attending: Emergency Medicine | Admitting: Emergency Medicine

## 2017-12-28 DIAGNOSIS — Z79899 Other long term (current) drug therapy: Secondary | ICD-10-CM | POA: Insufficient documentation

## 2017-12-28 DIAGNOSIS — F17210 Nicotine dependence, cigarettes, uncomplicated: Secondary | ICD-10-CM | POA: Insufficient documentation

## 2017-12-28 DIAGNOSIS — M62830 Muscle spasm of back: Secondary | ICD-10-CM | POA: Insufficient documentation

## 2017-12-28 DIAGNOSIS — X500XXA Overexertion from strenuous movement or load, initial encounter: Secondary | ICD-10-CM | POA: Insufficient documentation

## 2017-12-28 MED ORDER — METHOCARBAMOL 500 MG PO TABS: 500.0000 mg | ORAL_TABLET | Freq: Two times a day (BID) | ORAL | 0 refills | Status: DC

## 2017-12-28 MED ORDER — LIDOCAINE 5 % EX PTCH: 1.0000 | MEDICATED_PATCH | CUTANEOUS | 0 refills | Status: DC

## 2017-12-28 NOTE — Discharge Instructions (Signed)
You can take Tylenol or Ibuprofen as directed for pain. You can alternate Tylenol and Ibuprofen every 4 hours. If you take Tylenol at 1pm, then you can take Ibuprofen at 5pm. Then you can take Tylenol again at 9pm.   Take Robaxin as prescribed. This medication will make you drowsy so do not drive or drink alcohol when taking it.  Use lidocaine patches as directed.  [facility] to establish a primary care doctor if you do not have one.   Return to the Emergency Department immediately for any worsening back pain, neck pain, difficulty walking, numbness/weaknss of your arms or legs, urinary or bowel accidents, fever or any other worsening or concerning symptoms.
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
## 2017-12-28 NOTE — ED Triage Notes (Signed)
Pt c/o R sided back pain "for a while" worse today after going to work. Denies injury.

## 2017-12-28 NOTE — ED Notes (Signed)
Patient verbalizes understanding of discharge instructions. Opportunity for questioning and answers were provided. Armband removed by staff, pt discharged from ED.  

## 2018-01-25 ENCOUNTER — Encounter (HOSPITAL_COMMUNITY): Payer: Self-pay

## 2018-01-25 ENCOUNTER — Emergency Department (HOSPITAL_COMMUNITY)
Admission: EM | Admit: 2018-01-25 | Discharge: 2018-01-25 | Disposition: A | Discharge status: Home / Self Care | Payer: Self-pay | Attending: Emergency Medicine | Admitting: Emergency Medicine

## 2018-01-25 ENCOUNTER — Emergency Department (HOSPITAL_COMMUNITY): Payer: Self-pay

## 2018-01-25 DIAGNOSIS — F17210 Nicotine dependence, cigarettes, uncomplicated: Secondary | ICD-10-CM | POA: Insufficient documentation

## 2018-01-25 DIAGNOSIS — Z79899 Other long term (current) drug therapy: Secondary | ICD-10-CM | POA: Insufficient documentation

## 2018-01-25 DIAGNOSIS — M25561 Pain in right knee: Secondary | ICD-10-CM | POA: Insufficient documentation

## 2018-01-25 NOTE — ED Notes (Signed)
Patient ambulatory to bathroom with steady gait at this time 

## 2018-01-25 NOTE — ED Notes (Signed)
Patient transported to X-ray 
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
## 2018-01-25 NOTE — Discharge Instructions (Addendum)
You were seen in the emergency department for continued right knee pain.  Your x-ray did not show any obvious fractures.  This is possibly related to arthritis but there could also be problems with the cartilage or ligaments.  You will need to follow-up with an orthopedic doctor for further evaluation.  Please continue to use anti-inflammatories like ibuprofen or Naprosyn and your knee brace.

## 2018-01-25 NOTE — ED Notes (Signed)
Patient verbalizes understanding of discharge instructions. Opportunity for questioning and answers were provided. Armband removed by staff, pt discharged from ED ambulatory.   
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
## 2018-02-08 ENCOUNTER — Emergency Department (HOSPITAL_COMMUNITY)
Admission: EM | Admit: 2018-02-08 | Discharge: 2018-02-08 | Disposition: A | Discharge status: Home / Self Care | Payer: PRIVATE HEALTH INSURANCE | Attending: Emergency Medicine | Admitting: Emergency Medicine

## 2018-02-08 ENCOUNTER — Other Ambulatory Visit: Payer: Self-pay

## 2018-02-08 ENCOUNTER — Encounter (HOSPITAL_COMMUNITY): Payer: Self-pay

## 2018-02-08 DIAGNOSIS — F17210 Nicotine dependence, cigarettes, uncomplicated: Secondary | ICD-10-CM | POA: Insufficient documentation

## 2018-02-08 DIAGNOSIS — Z79899 Other long term (current) drug therapy: Secondary | ICD-10-CM | POA: Insufficient documentation

## 2018-02-08 DIAGNOSIS — B070 Plantar wart: Secondary | ICD-10-CM | POA: Insufficient documentation

## 2018-02-08 NOTE — ED Triage Notes (Signed)
Pt states he has a callous on the bottom of left foot that has been bothering him for a while. Pt states he has been cutting it off, but it seems to grow back. Pt states that it is causing his foot to swell. Pt ambulatory.
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
## 2018-02-08 NOTE — ED Notes (Signed)
Pt left without receiving d/c vitals. Pt was alert and ambulatory and in no active distress when he left. Registration witnessed pt leave

## 2018-03-02 ENCOUNTER — Emergency Department (HOSPITAL_COMMUNITY)
Admission: EM | Admit: 2018-03-02 | Discharge: 2018-03-02 | Disposition: A | Discharge status: Home / Self Care | Payer: Self-pay | Attending: Emergency Medicine | Admitting: Emergency Medicine

## 2018-03-02 ENCOUNTER — Emergency Department (HOSPITAL_COMMUNITY): Payer: Self-pay

## 2018-03-02 DIAGNOSIS — F17210 Nicotine dependence, cigarettes, uncomplicated: Secondary | ICD-10-CM | POA: Insufficient documentation

## 2018-03-02 DIAGNOSIS — M779 Enthesopathy, unspecified: Secondary | ICD-10-CM

## 2018-03-02 DIAGNOSIS — M70821 Other soft tissue disorders related to use, overuse and pressure, right upper arm: Secondary | ICD-10-CM | POA: Insufficient documentation

## 2018-03-02 DIAGNOSIS — M778 Other enthesopathies, not elsewhere classified: Secondary | ICD-10-CM

## 2018-03-02 DIAGNOSIS — Y939 Activity, unspecified: Secondary | ICD-10-CM | POA: Insufficient documentation

## 2018-03-02 DIAGNOSIS — Z79899 Other long term (current) drug therapy: Secondary | ICD-10-CM | POA: Insufficient documentation

## 2018-03-02 MED ORDER — NAPROXEN 500 MG PO TABS: 500.0000 mg | ORAL_TABLET | Freq: Two times a day (BID) | ORAL | 0 refills | Status: DC

## 2018-03-02 NOTE — ED Notes (Signed)
Patient verbalizes understanding of discharge instructions. Opportunity for questioning and answers were provided. Armband removed by staff, pt discharged from ED.  
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
## 2018-03-10 ENCOUNTER — Emergency Department (HOSPITAL_COMMUNITY)
Admission: EM | Admit: 2018-03-10 | Discharge: 2018-03-10 | Disposition: A | Discharge status: Home / Self Care | Payer: Self-pay | Attending: Emergency Medicine | Admitting: Emergency Medicine

## 2018-03-10 ENCOUNTER — Encounter (HOSPITAL_COMMUNITY): Payer: Self-pay

## 2018-03-10 ENCOUNTER — Other Ambulatory Visit: Payer: Self-pay

## 2018-03-10 DIAGNOSIS — R1013 Epigastric pain: Secondary | ICD-10-CM | POA: Insufficient documentation

## 2018-03-10 DIAGNOSIS — F17210 Nicotine dependence, cigarettes, uncomplicated: Secondary | ICD-10-CM | POA: Insufficient documentation

## 2018-03-10 DIAGNOSIS — Z79899 Other long term (current) drug therapy: Secondary | ICD-10-CM | POA: Insufficient documentation

## 2018-03-10 DIAGNOSIS — R112 Nausea with vomiting, unspecified: Secondary | ICD-10-CM

## 2018-03-10 LAB — CBC WITH DIFFERENTIAL/PLATELET
Abs Immature Granulocytes: 0.02 10*3/uL (ref 0.00–0.07)
Basophils Absolute: 0 10*3/uL (ref 0.0–0.1)
Basophils Relative: 1 %
EOS PCT: 3 %
Eosinophils Absolute: 0.1 10*3/uL (ref 0.0–0.5)
HEMATOCRIT: 41.9 % (ref 39.0–52.0)
HEMOGLOBIN: 13.2 g/dL (ref 13.0–17.0)
Immature Granulocytes: 0 %
Lymphocytes Relative: 32 %
Lymphs Abs: 1.5 10*3/uL (ref 0.7–4.0)
MCH: 31 pg (ref 26.0–34.0)
MCHC: 31.5 g/dL (ref 30.0–36.0)
MCV: 98.4 fL (ref 80.0–100.0)
Monocytes Absolute: 0.5 10*3/uL (ref 0.1–1.0)
Monocytes Relative: 11 %
Neutro Abs: 2.5 10*3/uL (ref 1.7–7.7)
Neutrophils Relative %: 53 %
Platelets: 236 10*3/uL (ref 150–400)
RBC: 4.26 MIL/uL (ref 4.22–5.81)
RDW: 13.3 % (ref 11.5–15.5)
WBC: 4.7 10*3/uL (ref 4.0–10.5)
nRBC: 0 % (ref 0.0–0.2)

## 2018-03-10 LAB — COMPREHENSIVE METABOLIC PANEL
ALT: 29 U/L (ref 0–44)
AST: 24 U/L (ref 15–41)
Albumin: 3.9 g/dL (ref 3.5–5.0)
Alkaline Phosphatase: 82 U/L (ref 38–126)
Anion gap: 5 (ref 5–15)
BUN: 9 mg/dL (ref 6–20)
CO2: 29 mmol/L (ref 22–32)
CREATININE: 0.97 mg/dL (ref 0.61–1.24)
Calcium: 8.8 mg/dL — ABNORMAL LOW (ref 8.9–10.3)
Chloride: 105 mmol/L (ref 98–111)
Glucose, Bld: 90 mg/dL (ref 70–99)
Potassium: 3.4 mmol/L — ABNORMAL LOW (ref 3.5–5.1)
Sodium: 139 mmol/L (ref 135–145)
Total Bilirubin: 0.9 mg/dL (ref 0.3–1.2)
Total Protein: 6.7 g/dL (ref 6.5–8.1)

## 2018-03-10 LAB — LIPASE, BLOOD: Lipase: 47 U/L (ref 11–51)

## 2018-03-10 MED ORDER — ONDANSETRON 8 MG PO TBDP
8.0000 mg | ORAL_TABLET | Freq: Once | ORAL | Status: AC
  Administered 2018-03-10: 8 mg via ORAL
  Filled 2018-03-10: qty 1

## 2018-03-10 MED ORDER — FAMOTIDINE 20 MG PO TABS
40.0000 mg | ORAL_TABLET | Freq: Once | ORAL | Status: AC
  Administered 2018-03-10: 40 mg via ORAL
  Filled 2018-03-10: qty 2

## 2018-03-10 MED ORDER — FAMOTIDINE 20 MG PO TABS: 20.0000 mg | ORAL_TABLET | Freq: Two times a day (BID) | ORAL | 0 refills | Status: DC

## 2018-03-10 MED ORDER — ONDANSETRON 4 MG PO TBDP: 4.0000 mg | ORAL_TABLET | Freq: Three times a day (TID) | ORAL | 0 refills | Status: DC | PRN
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
## 2018-03-10 NOTE — Discharge Instructions (Signed)
Take Zofran for nausea Take Pepcid for stomach pain Return if worsening

## 2018-03-10 NOTE — ED Notes (Signed)
RX X 2 TO BE PICKED UP. WORK NOTE GIVEN

## 2018-03-10 NOTE — ED Triage Notes (Signed)
Pt states he has had emesis and cramping in his epigastric area since last night. Denies diarrhea.  Pt states 2 episodes of emesis.

## 2018-07-07 ENCOUNTER — Other Ambulatory Visit: Payer: Self-pay

## 2018-07-07 ENCOUNTER — Emergency Department
Admission: EM | Admit: 2018-07-07 | Discharge: 2018-07-07 | Disposition: A | Discharge status: Home / Self Care | Payer: Self-pay | Attending: Emergency Medicine | Admitting: Emergency Medicine

## 2018-07-07 DIAGNOSIS — R197 Diarrhea, unspecified: Secondary | ICD-10-CM | POA: Insufficient documentation

## 2018-07-07 DIAGNOSIS — F1210 Cannabis abuse, uncomplicated: Secondary | ICD-10-CM | POA: Insufficient documentation

## 2018-07-07 DIAGNOSIS — R1084 Generalized abdominal pain: Secondary | ICD-10-CM | POA: Insufficient documentation

## 2018-07-07 DIAGNOSIS — F17210 Nicotine dependence, cigarettes, uncomplicated: Secondary | ICD-10-CM | POA: Insufficient documentation

## 2018-07-07 LAB — COMPREHENSIVE METABOLIC PANEL
ALT: 31 U/L (ref 0–44)
AST: 24 U/L (ref 15–41)
Albumin: 4.2 g/dL (ref 3.5–5.0)
Alkaline Phosphatase: 77 U/L (ref 38–126)
Anion gap: 8 (ref 5–15)
BUN: 17 mg/dL (ref 6–20)
CO2: 26 mmol/L (ref 22–32)
Calcium: 9 mg/dL (ref 8.9–10.3)
Chloride: 106 mmol/L (ref 98–111)
Creatinine, Ser: 0.84 mg/dL (ref 0.61–1.24)
GFR calc Af Amer: >60 mL/min (ref >60)
GFR calc non Af Amer: >60 mL/min (ref >60)
Glucose, Bld: 97 mg/dL (ref 70–99)
Potassium: 4 mmol/L (ref 3.5–5.1)
Sodium: 140 mmol/L (ref 135–145)
Total Bilirubin: 0.7 mg/dL (ref 0.3–1.2)
Total Protein: 7.4 g/dL (ref 6.5–8.1)

## 2018-07-07 LAB — URINALYSIS, COMPLETE (UACMP) WITH MICROSCOPIC
Bacteria, UA: NONE SEEN
Bilirubin Urine: NEGATIVE
Glucose, UA: NEGATIVE mg/dL
Hgb urine dipstick: NEGATIVE
Ketones, ur: NEGATIVE mg/dL
Leukocytes,Ua: NEGATIVE
Nitrite: NEGATIVE
Protein, ur: NEGATIVE mg/dL
Specific Gravity, Urine: 1.023 (ref 1.005–1.030)
pH: 7 (ref 5.0–8.0)

## 2018-07-07 LAB — LIPASE, BLOOD: Lipase: 52 U/L — ABNORMAL HIGH (ref 11–51)

## 2018-07-07 LAB — CBC
HCT: 44.1 % (ref 39.0–52.0)
Hemoglobin: 14.2 g/dL (ref 13.0–17.0)
MCH: 30.7 pg (ref 26.0–34.0)
MCHC: 32.2 g/dL (ref 30.0–36.0)
MCV: 95.2 fL (ref 80.0–100.0)
Platelets: 251 10*3/uL (ref 150–400)
RBC: 4.63 MIL/uL (ref 4.22–5.81)
RDW: 13.1 % (ref 11.5–15.5)
WBC: 6.1 10*3/uL (ref 4.0–10.5)
nRBC: 0 % (ref 0.0–0.2)

## 2018-07-07 MED ORDER — ONDANSETRON 4 MG PO TBDP: 4.0000 mg | ORAL_TABLET | Freq: Three times a day (TID) | ORAL | 0 refills | Status: DC | PRN

## 2018-07-07 MED ORDER — SODIUM CHLORIDE 0.9% FLUSH: 3.0000 mL | Freq: Once | INTRAVENOUS | Status: DC

## 2018-07-07 MED ORDER — DICYCLOMINE HCL 20 MG PO TABS: 20.0000 mg | ORAL_TABLET | Freq: Three times a day (TID) | ORAL | 0 refills | Status: DC | PRN

## 2018-07-07 NOTE — ED Triage Notes (Signed)
Pt c/o abd cramping with diarrhea, cough, congestion, body aches for the past 2-3 days. States they had someone test positive for covid last Friday at this employment

## 2018-07-07 NOTE — ED Notes (Signed)
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

## 2018-07-07 NOTE — ED Notes (Signed)
Report given to Paige RN.
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
## 2018-07-27 ENCOUNTER — Other Ambulatory Visit: Payer: Self-pay

## 2018-07-27 ENCOUNTER — Encounter: Payer: Self-pay | Admitting: Emergency Medicine

## 2018-07-27 ENCOUNTER — Emergency Department
Admission: EM | Admit: 2018-07-27 | Discharge: 2018-07-27 | Disposition: A | Discharge status: Home / Self Care | Payer: HRSA Program | Attending: Emergency Medicine | Admitting: Emergency Medicine

## 2018-07-27 ENCOUNTER — Emergency Department: Payer: HRSA Program

## 2018-07-27 DIAGNOSIS — F17210 Nicotine dependence, cigarettes, uncomplicated: Secondary | ICD-10-CM | POA: Insufficient documentation

## 2018-07-27 DIAGNOSIS — Z20828 Contact with and (suspected) exposure to other viral communicable diseases: Secondary | ICD-10-CM | POA: Diagnosis not present

## 2018-07-27 DIAGNOSIS — F1210 Cannabis abuse, uncomplicated: Secondary | ICD-10-CM | POA: Diagnosis not present

## 2018-07-27 DIAGNOSIS — R0602 Shortness of breath: Secondary | ICD-10-CM | POA: Insufficient documentation

## 2018-07-27 DIAGNOSIS — R5383 Other fatigue: Secondary | ICD-10-CM | POA: Diagnosis not present

## 2018-07-27 DIAGNOSIS — Z79899 Other long term (current) drug therapy: Secondary | ICD-10-CM | POA: Diagnosis not present

## 2018-07-27 DIAGNOSIS — R509 Fever, unspecified: Secondary | ICD-10-CM | POA: Insufficient documentation

## 2018-07-27 DIAGNOSIS — Z20822 Contact with and (suspected) exposure to covid-19: Secondary | ICD-10-CM

## 2018-07-27 LAB — COMPREHENSIVE METABOLIC PANEL
ALT: 30 U/L (ref 0–44)
AST: 28 U/L (ref 15–41)
Albumin: 4.5 g/dL (ref 3.5–5.0)
Alkaline Phosphatase: 74 U/L (ref 38–126)
Anion gap: 8 (ref 5–15)
BUN: 8 mg/dL (ref 6–20)
CO2: 26 mmol/L (ref 22–32)
Calcium: 9 mg/dL (ref 8.9–10.3)
Chloride: 105 mmol/L (ref 98–111)
Creatinine, Ser: 0.8 mg/dL (ref 0.61–1.24)
GFR calc Af Amer: >60 mL/min (ref >60)
GFR calc non Af Amer: >60 mL/min (ref >60)
Glucose, Bld: 134 mg/dL — ABNORMAL HIGH (ref 70–99)
Potassium: 3.4 mmol/L — ABNORMAL LOW (ref 3.5–5.1)
Sodium: 139 mmol/L (ref 135–145)
Total Bilirubin: 0.8 mg/dL (ref 0.3–1.2)
Total Protein: 7.4 g/dL (ref 6.5–8.1)

## 2018-07-27 LAB — CBC
HCT: 41.6 % (ref 39.0–52.0)
Hemoglobin: 13.6 g/dL (ref 13.0–17.0)
MCH: 30.5 pg (ref 26.0–34.0)
MCHC: 32.7 g/dL (ref 30.0–36.0)
MCV: 93.3 fL (ref 80.0–100.0)
Platelets: 257 10*3/uL (ref 150–400)
RBC: 4.46 MIL/uL (ref 4.22–5.81)
RDW: 13.2 % (ref 11.5–15.5)
WBC: 7.2 10*3/uL (ref 4.0–10.5)
nRBC: 0 % (ref 0.0–0.2)

## 2018-07-27 LAB — LIPASE, BLOOD: Lipase: 45 U/L (ref 11–51)

## 2018-07-27 NOTE — ED Triage Notes (Signed)
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 PT here on 6/22 for the same complaints.  Pt reports decreased appetite as well. No distress noted on arrival, pt able to speak in complete sentences without running out of breath.
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
## 2018-07-29 LAB — NOVEL CORONAVIRUS, NAA (HOSP ORDER, SEND-OUT TO REF LAB; TAT 18-24 HRS): SARS-CoV-2, NAA: NOT DETECTED

## 2018-08-06 ENCOUNTER — Emergency Department
Admission: EM | Admit: 2018-08-06 | Discharge: 2018-08-06 | Disposition: A | Discharge status: Home / Self Care | Payer: Self-pay | Attending: Emergency Medicine | Admitting: Emergency Medicine

## 2018-08-06 ENCOUNTER — Emergency Department: Payer: Self-pay

## 2018-08-06 ENCOUNTER — Other Ambulatory Visit: Payer: Self-pay

## 2018-08-06 ENCOUNTER — Encounter: Payer: Self-pay | Admitting: Emergency Medicine

## 2018-08-06 DIAGNOSIS — Z20828 Contact with and (suspected) exposure to other viral communicable diseases: Secondary | ICD-10-CM | POA: Insufficient documentation

## 2018-08-06 DIAGNOSIS — Z79899 Other long term (current) drug therapy: Secondary | ICD-10-CM | POA: Insufficient documentation

## 2018-08-06 DIAGNOSIS — K219 Gastro-esophageal reflux disease without esophagitis: Secondary | ICD-10-CM | POA: Insufficient documentation

## 2018-08-06 DIAGNOSIS — F17210 Nicotine dependence, cigarettes, uncomplicated: Secondary | ICD-10-CM | POA: Insufficient documentation

## 2018-08-06 DIAGNOSIS — R070 Pain in throat: Secondary | ICD-10-CM | POA: Insufficient documentation

## 2018-08-06 LAB — COMPREHENSIVE METABOLIC PANEL
ALT: 46 U/L — ABNORMAL HIGH (ref 0–44)
AST: 39 U/L (ref 15–41)
Albumin: 4 g/dL (ref 3.5–5.0)
Alkaline Phosphatase: 76 U/L (ref 38–126)
Anion gap: 8 (ref 5–15)
BUN: 12 mg/dL (ref 6–20)
CO2: 29 mmol/L (ref 22–32)
Calcium: 9 mg/dL (ref 8.9–10.3)
Chloride: 101 mmol/L (ref 98–111)
Creatinine, Ser: 1.09 mg/dL (ref 0.61–1.24)
GFR calc Af Amer: >60 mL/min (ref >60)
GFR calc non Af Amer: >60 mL/min (ref >60)
Glucose, Bld: 106 mg/dL — ABNORMAL HIGH (ref 70–99)
Potassium: 3.8 mmol/L (ref 3.5–5.1)
Sodium: 138 mmol/L (ref 135–145)
Total Bilirubin: 0.9 mg/dL (ref 0.3–1.2)
Total Protein: 7 g/dL (ref 6.5–8.1)

## 2018-08-06 LAB — CBC WITH DIFFERENTIAL/PLATELET
Abs Immature Granulocytes: 0.04 10*3/uL (ref 0.00–0.07)
Basophils Absolute: 0 10*3/uL (ref 0.0–0.1)
Basophils Relative: 0 %
Eosinophils Absolute: 0 10*3/uL (ref 0.0–0.5)
Eosinophils Relative: 0 %
HCT: 44.6 % (ref 39.0–52.0)
Hemoglobin: 14.2 g/dL (ref 13.0–17.0)
Immature Granulocytes: 0 %
Lymphocytes Relative: 10 %
Lymphs Abs: 0.9 10*3/uL (ref 0.7–4.0)
MCH: 30.7 pg (ref 26.0–34.0)
MCHC: 31.8 g/dL (ref 30.0–36.0)
MCV: 96.5 fL (ref 80.0–100.0)
Monocytes Absolute: 1.1 10*3/uL — ABNORMAL HIGH (ref 0.1–1.0)
Monocytes Relative: 12 %
Neutro Abs: 7.2 10*3/uL (ref 1.7–7.7)
Neutrophils Relative %: 78 %
Platelets: 215 10*3/uL (ref 150–400)
RBC: 4.62 MIL/uL (ref 4.22–5.81)
RDW: 13.5 % (ref 11.5–15.5)
WBC: 9.4 10*3/uL (ref 4.0–10.5)
nRBC: 0 % (ref 0.0–0.2)

## 2018-08-06 LAB — GROUP A STREP BY PCR: Group A Strep by PCR: NOT DETECTED

## 2018-08-06 LAB — LIPASE, BLOOD: Lipase: 54 U/L — ABNORMAL HIGH (ref 11–51)

## 2018-08-06 LAB — ETHANOL: Alcohol, Ethyl (B): <10 mg/dL (ref <10)

## 2018-08-06 MED ORDER — PANTOPRAZOLE SODIUM 40 MG IV SOLR
40.0000 mg | Freq: Once | INTRAVENOUS | Status: AC
  Administered 2018-08-06: 08:00:00 40 mg via INTRAVENOUS
  Filled 2018-08-06: qty 40

## 2018-08-06 MED ORDER — SODIUM CHLORIDE 0.9 % IV BOLUS
1000.0000 mL | Freq: Once | INTRAVENOUS | Status: AC
  Administered 2018-08-06: 08:00:00 1000 mL via INTRAVENOUS

## 2018-08-06 MED ORDER — FAMOTIDINE 20 MG PO TABS: 20.0000 mg | ORAL_TABLET | Freq: Two times a day (BID) | ORAL | 0 refills | Status: DC

## 2018-08-06 MED ORDER — KETOROLAC TROMETHAMINE 30 MG/ML IJ SOLN
15.0000 mg | INTRAMUSCULAR | Status: AC
  Administered 2018-08-06: 08:00:00 15 mg via INTRAVENOUS
  Filled 2018-08-06: qty 1

## 2018-08-06 MED ORDER — METOCLOPRAMIDE HCL 10 MG PO TABS: 10.0000 mg | ORAL_TABLET | Freq: Four times a day (QID) | ORAL | 0 refills | Status: DC | PRN

## 2018-08-06 MED ORDER — ALUM & MAG HYDROXIDE-SIMETH 200-200-20 MG/5ML PO SUSP
30.0000 mL | Freq: Once | ORAL | Status: AC
  Administered 2018-08-06: 12:00:00 30 mL via ORAL
  Filled 2018-08-06: qty 30

## 2018-08-06 MED ORDER — IOHEXOL 300 MG/ML  SOLN
100.0000 mL | Freq: Once | INTRAMUSCULAR | Status: AC | PRN
  Administered 2018-08-06: 12:00:00 100 mL via INTRAVENOUS

## 2018-08-06 MED ORDER — LIDOCAINE VISCOUS HCL 2 % MT SOLN
15.0000 mL | Freq: Once | OROMUCOSAL | Status: AC
  Administered 2018-08-06: 12:00:00 15 mL via OROMUCOSAL
  Filled 2018-08-06: qty 15

## 2018-08-06 MED ORDER — ALUMINUM-MAGNESIUM-SIMETHICONE 200-200-20 MG/5ML PO SUSP: 30.0000 mL | Freq: Three times a day (TID) | ORAL | 0 refills | Status: DC

## 2018-08-06 NOTE — ED Notes (Signed)
This RN to bedside, pt awakens easily to mild stimuli at this time, VS obtained, pt given another warm blanket per his request. NAD noted at this time. Will continue to monitor for further patient needs.

## 2018-08-06 NOTE — ED Notes (Signed)
Pt ambulatory to the bathroom without difficulty.

## 2018-08-06 NOTE — Discharge Instructions (Addendum)
Your labs and CT scan of the abdomen were all okay today.  Your symptoms appear to be due to stomach inflammation.

## 2018-08-06 NOTE — ED Notes (Signed)
Pt resting in bed at this time with eyes closed, respirations even and unlabored. Lights remain dimmed for patient comfort. Will continue to monitor for further patient needs.

## 2018-08-06 NOTE — ED Triage Notes (Signed)
Pt c/o sore throat and fever since 6/22. Pt was COVID tested on 7/12 with negative result.
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
## 2018-08-06 NOTE — ED Notes (Signed)
Pt transported to CT ?

## 2018-08-06 NOTE — ED Notes (Signed)
This RN to bedside, introduced self to patient. IV started and meds administered per MD order. Pt tolerated well. VSS and WNL. Will continue to monitor for further patient needs. Call bell within reach of patient, instructed to call out if needs arose. Lights dimmed for patient comfort. Will continue to monitor for further patient needs.

## 2018-08-07 LAB — NOVEL CORONAVIRUS, NAA (HOSP ORDER, SEND-OUT TO REF LAB; TAT 18-24 HRS): SARS-CoV-2, NAA: NOT DETECTED

## 2018-09-29 ENCOUNTER — Other Ambulatory Visit: Payer: Self-pay

## 2018-09-29 ENCOUNTER — Encounter: Payer: Self-pay | Admitting: Emergency Medicine

## 2018-09-29 ENCOUNTER — Emergency Department
Admission: EM | Admit: 2018-09-29 | Discharge: 2018-09-29 | Disposition: A | Discharge status: Home / Self Care | Payer: Self-pay | Attending: Emergency Medicine | Admitting: Emergency Medicine

## 2018-09-29 DIAGNOSIS — R21 Rash and other nonspecific skin eruption: Secondary | ICD-10-CM | POA: Insufficient documentation

## 2018-09-29 DIAGNOSIS — F17210 Nicotine dependence, cigarettes, uncomplicated: Secondary | ICD-10-CM | POA: Insufficient documentation

## 2018-09-29 DIAGNOSIS — F1210 Cannabis abuse, uncomplicated: Secondary | ICD-10-CM | POA: Insufficient documentation

## 2018-09-29 NOTE — ED Notes (Signed)
See triage note  Presents with rash to hands,feet,and groin    States he noticed the rash about 1 week   He noticed some rash to penis today
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
## 2018-09-29 NOTE — Discharge Instructions (Addendum)
You have a rash on the bilateral lateral palms and soles.Further evaluation by [facility] is warranted.  Please call today or tomorrow morning to schedule an appointment.  Tell them you are a follow-up from the emergency room.

## 2018-09-29 NOTE — ED Triage Notes (Signed)
Rash hands, legs and groin now started one week ago.

## 2018-10-01 ENCOUNTER — Ambulatory Visit: Payer: Self-pay | Admitting: Physician Assistant

## 2018-10-01 ENCOUNTER — Encounter: Payer: Self-pay | Admitting: Physician Assistant

## 2018-10-01 ENCOUNTER — Other Ambulatory Visit: Payer: Self-pay

## 2018-10-01 DIAGNOSIS — Z113 Encounter for screening for infections with a predominantly sexual mode of transmission: Secondary | ICD-10-CM

## 2018-10-01 DIAGNOSIS — A539 Syphilis, unspecified: Secondary | ICD-10-CM | POA: Insufficient documentation

## 2018-10-01 HISTORY — DX: Syphilis, unspecified: A53.9

## 2018-10-01 MED ORDER — PENICILLIN G BENZATHINE 1200000 UNIT/2ML IM SUSP
2.4000 10*6.[IU] | Freq: Once | INTRAMUSCULAR | Status: AC
  Administered 2018-10-01: 2.4 10*6.[IU] via INTRAMUSCULAR
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
## 2018-10-06 LAB — CT, NG, MYCOPLASMAS NAA, URINE
Chlamydia trachomatis, NAA: NEGATIVE
Mycoplasma genitalium NAA: POSITIVE — AB
Mycoplasma hominis NAA: POSITIVE — AB
Neisseria gonorrhoeae, NAA: NEGATIVE
Ureaplasma spp NAA: POSITIVE — AB

## 2018-10-21 ENCOUNTER — Ambulatory Visit: Payer: Self-pay

## 2018-10-22 ENCOUNTER — Other Ambulatory Visit: Payer: Self-pay

## 2018-11-17 ENCOUNTER — Other Ambulatory Visit: Payer: Self-pay

## 2018-11-17 ENCOUNTER — Encounter (HOSPITAL_COMMUNITY): Payer: Self-pay | Admitting: Emergency Medicine

## 2018-11-17 ENCOUNTER — Emergency Department (HOSPITAL_COMMUNITY)
Admission: EM | Admit: 2018-11-17 | Discharge: 2018-11-17 | Disposition: A | Discharge status: Home / Self Care | Payer: PRIVATE HEALTH INSURANCE | Attending: Emergency Medicine | Admitting: Emergency Medicine

## 2018-11-17 DIAGNOSIS — Z20822 Contact with and (suspected) exposure to covid-19: Secondary | ICD-10-CM

## 2018-11-17 DIAGNOSIS — F17210 Nicotine dependence, cigarettes, uncomplicated: Secondary | ICD-10-CM | POA: Diagnosis not present

## 2018-11-17 DIAGNOSIS — Z20828 Contact with and (suspected) exposure to other viral communicable diseases: Secondary | ICD-10-CM | POA: Diagnosis not present

## 2018-11-17 DIAGNOSIS — B349 Viral infection, unspecified: Secondary | ICD-10-CM | POA: Diagnosis not present

## 2018-11-17 DIAGNOSIS — R509 Fever, unspecified: Secondary | ICD-10-CM | POA: Diagnosis present

## 2018-11-17 LAB — SARS CORONAVIRUS 2 (TAT 6-24 HRS): SARS Coronavirus 2: NEGATIVE

## 2018-11-17 MED ORDER — FLUTICASONE PROPIONATE 50 MCG/ACT NA SUSP: 2.0000 | Freq: Every day | NASAL | 0 refills | Status: DC

## 2018-11-17 MED ORDER — BENZONATATE 100 MG PO CAPS: 100.0000 mg | ORAL_CAPSULE | Freq: Three times a day (TID) | ORAL | 0 refills | Status: AC

## 2018-11-17 NOTE — ED Triage Notes (Signed)
Pt states he has been having cough/fever/bodyaches all weekend.

## 2018-11-17 NOTE — Discharge Instructions (Addendum)
°
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
## 2018-11-17 NOTE — ED Notes (Signed)
Patient in room waiting for exam.  Patient states he would like a work note because his job requires one when he has been seen.  Patient does not want to stay.  I explained I am unable to write a note he was seen in the ER if he hasn't been seen by provider and treated.

## 2018-11-18 ENCOUNTER — Encounter (HOSPITAL_COMMUNITY): Payer: Self-pay | Admitting: Emergency Medicine

## 2018-11-18 ENCOUNTER — Emergency Department (HOSPITAL_COMMUNITY)
Admission: EM | Admit: 2018-11-18 | Discharge: 2018-11-18 | Discharge status: Against Medical Advice | Payer: PRIVATE HEALTH INSURANCE | Attending: Emergency Medicine | Admitting: Emergency Medicine

## 2018-11-18 DIAGNOSIS — Z79899 Other long term (current) drug therapy: Secondary | ICD-10-CM | POA: Diagnosis not present

## 2018-11-18 DIAGNOSIS — R5381 Other malaise: Secondary | ICD-10-CM | POA: Diagnosis present

## 2018-11-18 DIAGNOSIS — B349 Viral infection, unspecified: Secondary | ICD-10-CM | POA: Insufficient documentation

## 2018-11-18 DIAGNOSIS — F17210 Nicotine dependence, cigarettes, uncomplicated: Secondary | ICD-10-CM | POA: Insufficient documentation

## 2018-11-18 MED ORDER — ONDANSETRON 4 MG PO TBDP
ORAL_TABLET | ORAL | Status: AC
  Administered 2018-11-18: 10:00:00 4 mg
  Filled 2018-11-18: qty 2

## 2018-11-18 MED ORDER — ONDANSETRON 4 MG PO TBDP: 4.0000 mg | ORAL_TABLET | Freq: Three times a day (TID) | ORAL | 0 refills | Status: DC | PRN

## 2018-11-18 NOTE — ED Triage Notes (Signed)
Just here yesterday for covid awaiting test. Vomiting and shortness of breath. Sats 98%. Tingling in fingers that gets better when he takes deep breathes and calms down.
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
## 2018-11-19 ENCOUNTER — Emergency Department
Admission: EM | Admit: 2018-11-19 | Discharge: 2018-11-19 | Disposition: A | Discharge status: Home / Self Care | Payer: PRIVATE HEALTH INSURANCE | Attending: Emergency Medicine | Admitting: Emergency Medicine

## 2018-11-19 ENCOUNTER — Other Ambulatory Visit: Payer: Self-pay

## 2018-11-19 ENCOUNTER — Encounter: Payer: Self-pay | Admitting: *Deleted

## 2018-11-19 DIAGNOSIS — R109 Unspecified abdominal pain: Secondary | ICD-10-CM | POA: Insufficient documentation

## 2018-11-19 DIAGNOSIS — R1110 Vomiting, unspecified: Secondary | ICD-10-CM | POA: Insufficient documentation

## 2018-11-19 DIAGNOSIS — Z5321 Procedure and treatment not carried out due to patient leaving prior to being seen by health care provider: Secondary | ICD-10-CM | POA: Insufficient documentation

## 2018-11-19 DIAGNOSIS — R05 Cough: Secondary | ICD-10-CM | POA: Diagnosis not present

## 2018-11-19 LAB — COMPREHENSIVE METABOLIC PANEL
ALT: 38 U/L (ref 0–44)
AST: 53 U/L — ABNORMAL HIGH (ref 15–41)
Albumin: 5.3 g/dL — ABNORMAL HIGH (ref 3.5–5.0)
Alkaline Phosphatase: 105 U/L (ref 38–126)
Anion gap: 17 — ABNORMAL HIGH (ref 5–15)
BUN: 16 mg/dL (ref 6–20)
CO2: 20 mmol/L — ABNORMAL LOW (ref 22–32)
Calcium: 10.6 mg/dL — ABNORMAL HIGH (ref 8.9–10.3)
Chloride: 102 mmol/L (ref 98–111)
Creatinine, Ser: 1.54 mg/dL — ABNORMAL HIGH (ref 0.61–1.24)
GFR calc Af Amer: >60 mL/min (ref >60)
GFR calc non Af Amer: 56 mL/min — ABNORMAL LOW (ref >60)
Glucose, Bld: 139 mg/dL — ABNORMAL HIGH (ref 70–99)
Potassium: 2.9 mmol/L — ABNORMAL LOW (ref 3.5–5.1)
Sodium: 139 mmol/L (ref 135–145)
Total Bilirubin: 1.7 mg/dL — ABNORMAL HIGH (ref 0.3–1.2)
Total Protein: 9.4 g/dL — ABNORMAL HIGH (ref 6.5–8.1)

## 2018-11-19 LAB — CBC
HCT: 45.7 % (ref 39.0–52.0)
Hemoglobin: 15.6 g/dL (ref 13.0–17.0)
MCH: 30.5 pg (ref 26.0–34.0)
MCHC: 34.1 g/dL (ref 30.0–36.0)
MCV: 89.3 fL (ref 80.0–100.0)
Platelets: 327 10*3/uL (ref 150–400)
RBC: 5.12 MIL/uL (ref 4.22–5.81)
RDW: 13.3 % (ref 11.5–15.5)
WBC: 8 10*3/uL (ref 4.0–10.5)
nRBC: 0 % (ref 0.0–0.2)

## 2018-11-19 LAB — LIPASE, BLOOD: Lipase: 23 U/L (ref 11–51)

## 2018-11-19 MED ORDER — SODIUM CHLORIDE 0.9% FLUSH: 3.0000 mL | Freq: Once | INTRAVENOUS | Status: DC

## 2018-11-19 NOTE — ED Triage Notes (Signed)
Pt to triage via wheelchair.  Pt has abd pain , vomiting and cough.  Vomiting x 5.  No diarrhea.  Pt alert  Speech clear.

## 2018-11-19 NOTE — ED Notes (Signed)
Pt unable to void at this time. 

## 2018-11-19 NOTE — ED Notes (Signed)
No answer when called several times from lobby 

## 2018-11-21 ENCOUNTER — Telehealth: Payer: Self-pay | Admitting: Emergency Medicine

## 2018-11-21 NOTE — Telephone Encounter (Signed)
Called patient due to lwot to inquire about condition and follow up plans.  Left message asking him to call me back.  He has lab results that need to be reviewed by his pcp if he has one.

## 2018-11-25 ENCOUNTER — Emergency Department (HOSPITAL_COMMUNITY): Payer: PRIVATE HEALTH INSURANCE

## 2018-11-25 ENCOUNTER — Encounter (HOSPITAL_COMMUNITY): Payer: Self-pay | Admitting: Emergency Medicine

## 2018-11-25 ENCOUNTER — Emergency Department (HOSPITAL_COMMUNITY)
Admission: EM | Admit: 2018-11-25 | Discharge: 2018-11-25 | Disposition: A | Discharge status: Home / Self Care | Payer: PRIVATE HEALTH INSURANCE | Attending: Emergency Medicine | Admitting: Emergency Medicine

## 2018-11-25 DIAGNOSIS — J40 Bronchitis, not specified as acute or chronic: Secondary | ICD-10-CM | POA: Diagnosis not present

## 2018-11-25 DIAGNOSIS — R0602 Shortness of breath: Secondary | ICD-10-CM | POA: Diagnosis present

## 2018-11-25 DIAGNOSIS — Z87891 Personal history of nicotine dependence: Secondary | ICD-10-CM | POA: Diagnosis not present

## 2018-11-25 DIAGNOSIS — F1290 Cannabis use, unspecified, uncomplicated: Secondary | ICD-10-CM | POA: Insufficient documentation

## 2018-11-25 MED ORDER — ALBUTEROL SULFATE HFA 108 (90 BASE) MCG/ACT IN AERS
2.0000 | INHALATION_SPRAY | Freq: Once | RESPIRATORY_TRACT | Status: AC
  Administered 2018-11-25: 2 via RESPIRATORY_TRACT
  Filled 2018-11-25: qty 6.7

## 2018-11-25 NOTE — ED Triage Notes (Signed)
Pt here from home with c/o sob , he had a covid negative test but his works wants him  "cleared" to come back to work

## 2018-11-25 NOTE — Discharge Instructions (Signed)
Reactive bronchitis is very common after an upper respiratory infection I am reassured that all your other symptoms seem to be improving.  Use inhaler as needed for coughing spells and you can take over-the-counter cough medication such as Robitussin or Mucinex.  Follow-up with your primary care doctor.  You can return to work.  Return to the ED for any new or worsening symptoms.
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
## 2018-11-27 ENCOUNTER — Encounter: Payer: Self-pay | Admitting: Emergency Medicine

## 2018-11-27 ENCOUNTER — Other Ambulatory Visit: Payer: Self-pay

## 2018-11-27 ENCOUNTER — Emergency Department
Admission: EM | Admit: 2018-11-27 | Discharge: 2018-11-27 | Disposition: A | Discharge status: Home / Self Care | Payer: PRIVATE HEALTH INSURANCE | Attending: Emergency Medicine | Admitting: Emergency Medicine

## 2018-11-27 ENCOUNTER — Emergency Department: Payer: PRIVATE HEALTH INSURANCE

## 2018-11-27 DIAGNOSIS — M79662 Pain in left lower leg: Secondary | ICD-10-CM | POA: Diagnosis not present

## 2018-11-27 DIAGNOSIS — T148XXA Other injury of unspecified body region, initial encounter: Secondary | ICD-10-CM

## 2018-11-27 DIAGNOSIS — S86912A Strain of unspecified muscle(s) and tendon(s) at lower leg level, left leg, initial encounter: Secondary | ICD-10-CM | POA: Diagnosis not present

## 2018-11-27 DIAGNOSIS — Y999 Unspecified external cause status: Secondary | ICD-10-CM | POA: Diagnosis not present

## 2018-11-27 DIAGNOSIS — Z87891 Personal history of nicotine dependence: Secondary | ICD-10-CM | POA: Insufficient documentation

## 2018-11-27 DIAGNOSIS — Y939 Activity, unspecified: Secondary | ICD-10-CM | POA: Diagnosis not present

## 2018-11-27 DIAGNOSIS — M79605 Pain in left leg: Secondary | ICD-10-CM

## 2018-11-27 DIAGNOSIS — Y929 Unspecified place or not applicable: Secondary | ICD-10-CM | POA: Diagnosis not present

## 2018-11-27 DIAGNOSIS — X58XXXA Exposure to other specified factors, initial encounter: Secondary | ICD-10-CM | POA: Insufficient documentation

## 2018-11-27 DIAGNOSIS — S8992XA Unspecified injury of left lower leg, initial encounter: Secondary | ICD-10-CM | POA: Diagnosis present

## 2018-11-27 MED ORDER — CYCLOBENZAPRINE HCL 5 MG PO TABS: 5.0000 mg | ORAL_TABLET | Freq: Three times a day (TID) | ORAL | 0 refills | Status: DC | PRN

## 2018-11-27 MED ORDER — NAPROXEN 500 MG PO TABS: 500.0000 mg | ORAL_TABLET | Freq: Two times a day (BID) | ORAL | 0 refills | Status: AC
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
## 2018-11-27 NOTE — ED Notes (Signed)
See triage note    Presents with pain to left lower leg  States pain is mainly above left knee and radiates into lower leg  Ambulates well

## 2018-11-27 NOTE — Discharge Instructions (Signed)
Your exam and ultrasound are normal following your exam today. Take the prescription meds as directed. Follow-up with your provider for ongoing symptoms. Return to the ED as needed.

## 2019-01-29 ENCOUNTER — Emergency Department
Admission: EM | Admit: 2019-01-29 | Discharge: 2019-01-29 | Disposition: A | Discharge status: Home / Self Care | Payer: PRIVATE HEALTH INSURANCE | Attending: Emergency Medicine | Admitting: Emergency Medicine

## 2019-01-29 ENCOUNTER — Other Ambulatory Visit: Payer: Self-pay

## 2019-01-29 ENCOUNTER — Emergency Department: Payer: PRIVATE HEALTH INSURANCE

## 2019-01-29 DIAGNOSIS — Z20822 Contact with and (suspected) exposure to covid-19: Secondary | ICD-10-CM | POA: Diagnosis not present

## 2019-01-29 DIAGNOSIS — R1013 Epigastric pain: Secondary | ICD-10-CM | POA: Diagnosis present

## 2019-01-29 DIAGNOSIS — Z87891 Personal history of nicotine dependence: Secondary | ICD-10-CM | POA: Insufficient documentation

## 2019-01-29 DIAGNOSIS — F1210 Cannabis abuse, uncomplicated: Secondary | ICD-10-CM | POA: Insufficient documentation

## 2019-01-29 LAB — SARS CORONAVIRUS 2 (TAT 6-24 HRS): SARS Coronavirus 2: NEGATIVE
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
## 2019-01-29 NOTE — ED Triage Notes (Signed)
Pt states there is like 10 people at work with covid and he wants to be tested, states he is having a gassy, bubbly stomach.

## 2019-01-29 NOTE — Discharge Instructions (Signed)
Follow-up with your regular doctor if not improving in 3 to 5 days.  Return emergency department if worsening.  Covid test should result in 6 to 12 hours.  You should remain quarantined until your results is received.

## 2019-01-29 NOTE — ED Notes (Signed)
Patient reports having intermittent stomach cramps and sweating at night, was exposed to multiple people at work that tested positive for Covid.

## 2019-06-01 ENCOUNTER — Other Ambulatory Visit: Payer: PRIVATE HEALTH INSURANCE | Attending: General Surgery

## 2019-08-07 ENCOUNTER — Emergency Department
Admission: EM | Admit: 2019-08-07 | Discharge: 2019-08-07 | Disposition: A | Discharge status: Home / Self Care | Payer: PRIVATE HEALTH INSURANCE | Attending: Emergency Medicine | Admitting: Emergency Medicine

## 2019-08-07 ENCOUNTER — Emergency Department (HOSPITAL_COMMUNITY): Payer: PRIVATE HEALTH INSURANCE

## 2019-08-07 ENCOUNTER — Encounter: Payer: Self-pay | Admitting: Emergency Medicine

## 2019-08-07 ENCOUNTER — Emergency Department (HOSPITAL_COMMUNITY)
Admission: EM | Admit: 2019-08-07 | Discharge: 2019-08-07 | Disposition: A | Discharge status: Home / Self Care | Payer: PRIVATE HEALTH INSURANCE | Attending: Emergency Medicine | Admitting: Emergency Medicine

## 2019-08-07 ENCOUNTER — Encounter (HOSPITAL_COMMUNITY): Payer: Self-pay | Admitting: Pharmacy Technician

## 2019-08-07 ENCOUNTER — Other Ambulatory Visit: Payer: Self-pay

## 2019-08-07 ENCOUNTER — Emergency Department: Payer: PRIVATE HEALTH INSURANCE

## 2019-08-07 DIAGNOSIS — M79605 Pain in left leg: Secondary | ICD-10-CM | POA: Insufficient documentation

## 2019-08-07 DIAGNOSIS — M79651 Pain in right thigh: Secondary | ICD-10-CM | POA: Insufficient documentation

## 2019-08-07 DIAGNOSIS — Z87891 Personal history of nicotine dependence: Secondary | ICD-10-CM | POA: Insufficient documentation

## 2019-08-07 DIAGNOSIS — M79604 Pain in right leg: Secondary | ICD-10-CM | POA: Insufficient documentation

## 2019-08-07 DIAGNOSIS — M79645 Pain in left finger(s): Secondary | ICD-10-CM | POA: Insufficient documentation

## 2019-08-07 DIAGNOSIS — Y9289 Other specified places as the place of occurrence of the external cause: Secondary | ICD-10-CM | POA: Diagnosis not present

## 2019-08-07 DIAGNOSIS — Y9389 Activity, other specified: Secondary | ICD-10-CM | POA: Diagnosis not present

## 2019-08-07 DIAGNOSIS — Z5321 Procedure and treatment not carried out due to patient leaving prior to being seen by health care provider: Secondary | ICD-10-CM | POA: Insufficient documentation

## 2019-08-07 DIAGNOSIS — X500XXA Overexertion from strenuous movement or load, initial encounter: Secondary | ICD-10-CM | POA: Diagnosis not present

## 2019-08-07 DIAGNOSIS — F1210 Cannabis abuse, uncomplicated: Secondary | ICD-10-CM | POA: Insufficient documentation

## 2019-08-07 DIAGNOSIS — Y999 Unspecified external cause status: Secondary | ICD-10-CM | POA: Diagnosis not present

## 2019-08-07 DIAGNOSIS — M79652 Pain in left thigh: Secondary | ICD-10-CM | POA: Insufficient documentation

## 2019-08-07 LAB — COMPREHENSIVE METABOLIC PANEL
ALT: 27 U/L (ref 0–44)
AST: 30 U/L (ref 15–41)
Albumin: 4.1 g/dL (ref 3.5–5.0)
Alkaline Phosphatase: 86 U/L (ref 38–126)
Anion gap: 8 (ref 5–15)
BUN: 8 mg/dL (ref 6–20)
CO2: 26 mmol/L (ref 22–32)
Calcium: 8.9 mg/dL (ref 8.9–10.3)
Chloride: 105 mmol/L (ref 98–111)
Creatinine, Ser: 0.89 mg/dL (ref 0.61–1.24)
GFR calc Af Amer: >60 mL/min (ref >60)
GFR calc non Af Amer: >60 mL/min (ref >60)
Glucose, Bld: 141 mg/dL — ABNORMAL HIGH (ref 70–99)
Potassium: 3.7 mmol/L (ref 3.5–5.1)
Sodium: 139 mmol/L (ref 135–145)
Total Bilirubin: 0.9 mg/dL (ref 0.3–1.2)
Total Protein: 7.1 g/dL (ref 6.5–8.1)

## 2019-08-07 LAB — CBC
HCT: 40.1 % (ref 39.0–52.0)
Hemoglobin: 13.2 g/dL (ref 13.0–17.0)
MCH: 31.3 pg (ref 26.0–34.0)
MCHC: 32.9 g/dL (ref 30.0–36.0)
MCV: 95 fL (ref 80.0–100.0)
Platelets: 279 10*3/uL (ref 150–400)
RBC: 4.22 MIL/uL (ref 4.22–5.81)
RDW: 13.1 % (ref 11.5–15.5)
WBC: 4.9 10*3/uL (ref 4.0–10.5)
nRBC: 0 % (ref 0.0–0.2)

## 2019-08-07 LAB — URINALYSIS, COMPLETE (UACMP) WITH MICROSCOPIC
Bacteria, UA: NONE SEEN
Bilirubin Urine: NEGATIVE
Glucose, UA: NEGATIVE mg/dL
Hgb urine dipstick: NEGATIVE
Ketones, ur: NEGATIVE mg/dL
Nitrite: NEGATIVE
Protein, ur: NEGATIVE mg/dL
Specific Gravity, Urine: 1.023 (ref 1.005–1.030)
pH: 6 (ref 5.0–8.0)

## 2019-08-07 LAB — CK: Total CK: 613 U/L — ABNORMAL HIGH (ref 49–397)

## 2019-08-07 MED ORDER — MELOXICAM 15 MG PO TABS: 15.0000 mg | ORAL_TABLET | Freq: Every day | ORAL | 0 refills | Status: DC

## 2019-08-07 NOTE — ED Notes (Signed)
Called 2x for vitals no response

## 2019-08-07 NOTE — ED Notes (Signed)
See triage note  Presents with bilateral leg pain and swelling  Denies any injury  But has bruising to both thighs

## 2019-08-07 NOTE — ED Triage Notes (Addendum)
Pt arrives pov with reports of bilateral thigh pain and swelling. Pt states the machine at work hits his thighs due to the way it is set up. Pt states hx of cardiovascular issues and is worried about blood clots. Pt also c/o pain and swelling to L little finger "for a long time". States unable to bend finger.
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
## 2019-08-17 ENCOUNTER — Other Ambulatory Visit: Payer: PRIVATE HEALTH INSURANCE | Attending: General Surgery

## 2019-08-19 ENCOUNTER — Ambulatory Visit
Admission: RE | Admit: 2019-08-19 | Payer: PRIVATE HEALTH INSURANCE | Source: Home / Self Care | Admitting: General Surgery

## 2019-08-19 ENCOUNTER — Encounter: Admission: RE | Payer: Self-pay | Source: Home / Self Care

## 2019-08-19 SURGERY — EGD (ESOPHAGOGASTRODUODENOSCOPY)
Anesthesia: General

## 2019-10-04 ENCOUNTER — Emergency Department
Admission: EM | Admit: 2019-10-04 | Discharge: 2019-10-04 | Disposition: A | Discharge status: Home / Self Care | Payer: PRIVATE HEALTH INSURANCE | Attending: Student in an Organized Health Care Education/Training Program | Admitting: Student in an Organized Health Care Education/Training Program

## 2019-10-04 ENCOUNTER — Other Ambulatory Visit: Payer: Self-pay

## 2019-10-04 ENCOUNTER — Encounter: Payer: Self-pay | Admitting: Emergency Medicine

## 2019-10-04 DIAGNOSIS — Z87891 Personal history of nicotine dependence: Secondary | ICD-10-CM | POA: Diagnosis not present

## 2019-10-04 DIAGNOSIS — T148XXA Other injury of unspecified body region, initial encounter: Secondary | ICD-10-CM

## 2019-10-04 DIAGNOSIS — S161XXA Strain of muscle, fascia and tendon at neck level, initial encounter: Secondary | ICD-10-CM | POA: Diagnosis not present

## 2019-10-04 DIAGNOSIS — X58XXXA Exposure to other specified factors, initial encounter: Secondary | ICD-10-CM | POA: Diagnosis not present

## 2019-10-04 MED ORDER — IBUPROFEN 600 MG PO TABS: 600.0000 mg | ORAL_TABLET | Freq: Four times a day (QID) | ORAL | 0 refills | Status: DC | PRN

## 2019-10-04 MED ORDER — LIDOCAINE 5 % EX PTCH: 1.0000 | MEDICATED_PATCH | Freq: Two times a day (BID) | CUTANEOUS | 0 refills | Status: AC

## 2019-10-04 MED ORDER — METHOCARBAMOL 500 MG PO TABS: 500.0000 mg | ORAL_TABLET | Freq: Four times a day (QID) | ORAL | 0 refills | Status: DC

## 2019-10-04 NOTE — ED Triage Notes (Signed)
Presents with pain to left side of neck and into shoulder    States he has used heat and muscle relaxer's which he had at home  Denies any injury
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
## 2020-03-17 ENCOUNTER — Other Ambulatory Visit: Payer: Self-pay

## 2020-03-17 ENCOUNTER — Encounter (HOSPITAL_COMMUNITY): Payer: Self-pay | Admitting: Emergency Medicine

## 2020-03-17 ENCOUNTER — Ambulatory Visit (HOSPITAL_COMMUNITY)
Admission: EM | Admit: 2020-03-17 | Discharge: 2020-03-17 | Disposition: A | Discharge status: Home / Self Care | Payer: PRIVATE HEALTH INSURANCE | Attending: Family Medicine | Admitting: Family Medicine

## 2020-03-17 DIAGNOSIS — R319 Hematuria, unspecified: Secondary | ICD-10-CM | POA: Diagnosis not present

## 2020-03-17 LAB — POCT URINALYSIS DIPSTICK, ED / UC
Bilirubin Urine: NEGATIVE
Glucose, UA: NEGATIVE mg/dL
Ketones, ur: NEGATIVE mg/dL
Leukocytes,Ua: NEGATIVE
Nitrite: NEGATIVE
Protein, ur: NEGATIVE mg/dL
Specific Gravity, Urine: 1.025 (ref 1.005–1.030)
Urobilinogen, UA: 0.2 mg/dL (ref 0.0–1.0)
pH: 6.5 (ref 5.0–8.0)

## 2020-03-17 NOTE — Discharge Instructions (Addendum)
We have sent testing for sexually transmitted infections. We will notify you of any positive results once they are received. If required, we will prescribe any medications you might need.  Please refrain from all sexual activity for at least the next seven days.  

## 2020-03-17 NOTE — ED Triage Notes (Signed)
Pt presents today with lower abd pain and blood in urine x 2 days. Denies painful urination or discharge.

## 2020-03-19 LAB — URINE CULTURE: Culture: 40000 — AB

## 2020-03-21 LAB — CYTOLOGY, (ORAL, ANAL, URETHRAL) ANCILLARY ONLY
Chlamydia: NEGATIVE
Comment: NEGATIVE
Comment: NEGATIVE
Comment: NORMAL
Neisseria Gonorrhea: NEGATIVE
Trichomonas: NEGATIVE
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
## 2020-03-31 ENCOUNTER — Encounter: Payer: Self-pay | Admitting: Podiatry

## 2020-03-31 ENCOUNTER — Ambulatory Visit (INDEPENDENT_AMBULATORY_CARE_PROVIDER_SITE_OTHER): Payer: Self-pay | Admitting: Podiatry

## 2020-03-31 ENCOUNTER — Other Ambulatory Visit: Payer: Self-pay

## 2020-03-31 DIAGNOSIS — M79675 Pain in left toe(s): Secondary | ICD-10-CM

## 2020-03-31 DIAGNOSIS — M7742 Metatarsalgia, left foot: Secondary | ICD-10-CM

## 2020-03-31 DIAGNOSIS — R52 Pain, unspecified: Secondary | ICD-10-CM

## 2020-03-31 DIAGNOSIS — M2041 Other hammer toe(s) (acquired), right foot: Secondary | ICD-10-CM

## 2020-03-31 DIAGNOSIS — M2042 Other hammer toe(s) (acquired), left foot: Secondary | ICD-10-CM

## 2020-03-31 DIAGNOSIS — L84 Corns and callosities: Secondary | ICD-10-CM

## 2020-03-31 DIAGNOSIS — B351 Tinea unguium: Secondary | ICD-10-CM

## 2020-03-31 DIAGNOSIS — M7741 Metatarsalgia, right foot: Secondary | ICD-10-CM

## 2020-03-31 DIAGNOSIS — M79674 Pain in right toe(s): Secondary | ICD-10-CM

## 2020-03-31 MED ORDER — TERBINAFINE HCL 250 MG PO TABS: 250.0000 mg | ORAL_TABLET | Freq: Every day | ORAL | 0 refills | Status: AC

## 2020-03-31 NOTE — Patient Instructions (Addendum)
Look for urea 40% cream or ointment and apply to the thickened dry skin / calluses. This can be bought over the counter, at a pharmacy or online such as [employer].   More silicone pads can be purchased from:  [email or web address].[email or web address]  Corns and Calluses Corns are small areas of thickened skin that form on the top, sides, or tip of a toe. Corns have a cone-shaped core with a point that can press on a nerve below. This causes pain. Calluses are areas of thickened skin that can form anywhere on the body, including the hands, fingers, palms, soles of the feet, and heels. Calluses are usually larger than corns. What are the causes? Corns and calluses are caused by rubbing (friction) or pressure, such as from shoes that are too tight or do not fit properly. What increases the risk? Corns are more likely to develop in people who have misshapen toes (toe deformities), such as hammer toes. Calluses can form with friction to any area of the skin. They are more likely to develop in people who:  Work with their hands.  Wear shoes that fit poorly, are too tight, or are high-heeled.  Have toe deformities. What are the signs or symptoms? Symptoms of a corn or callus include:  A hard growth on the skin.  Pain or tenderness under the skin.  Redness and swelling.  Increased discomfort while wearing tight-fitting shoes, if your feet are affected. If a corn or callus becomes infected, symptoms may include:  Redness and swelling that gets worse.  Pain.  Fluid, blood, or pus draining from the corn or callus.   How is this diagnosed? Corns and calluses may be diagnosed based on your symptoms, your medical history, and a physical exam. How is this treated? Treatment for corns and calluses may include:  Removing the cause of the friction or pressure. This may involve: ? Changing your shoes. ? Wearing shoe inserts (orthotics) or other protective layers in your shoes, such as a corn  pad. ? Wearing gloves.  Applying medicine to the skin (topical medicine) to help soften skin in the hardened, thickened areas.  Removing layers of dead skin with a file to reduce the size of the corn or callus.  Removing the corn or callus with a scalpel or laser.  Taking antibiotic medicines, if your corn or callus is infected.  Having surgery, if a toe deformity is the cause. Follow these instructions at home:  Take over-the-counter and prescription medicines only as told by your health care provider.  If you were prescribed an antibiotic medicine, take it as told by your health care provider. Do not stop taking it even if your condition improves.  Wear shoes that fit well. Avoid wearing high-heeled shoes and shoes that are too tight or too loose.  Wear any padding, protective layers, gloves, or orthotics as told by your health care provider.  Soak your hands or feet. Then use a file or pumice stone to soften your corn or callus. Do this as told by your health care provider.  Check your corn or callus every day for signs of infection.   Contact a health care provider if:  Your symptoms do not improve with treatment.  You have redness or swelling that gets worse.  Your corn or callus becomes painful.  You have fluid, blood, or pus coming from your corn or callus.  You have new symptoms. Get help right away if:  You develop severe pain with redness. Summary  Corns are  small areas of thickened skin that form on the top, sides, or tip of a toe. These can be painful.  Calluses are areas of thickened skin that can form anywhere on the body, including the hands, fingers, palms, and soles of the feet. Calluses are usually larger than corns.  Corns and calluses are caused by rubbing (friction) or pressure, such as from shoes that are too tight or do not fit properly.  Treatment may include wearing padding, protective layers, gloves, or orthotics as told by your health care  provider. This information is not intended to replace advice given to you by your health care provider. Make sure you discuss any questions you have with your health care provider. Document Revised: 04/30/2019 Document Reviewed: 04/30/2019 Elsevier Patient Education  2021 [employer]  Fungal Nail Infection A fungal nail infection is a common infection of the toenails or fingernails. This condition affects toenails more often than fingernails. It often affects the great, or big, toes. More than one nail may be infected. The condition can be passed from person to person (is contagious). What are the causes? This condition is caused by a fungus. Several types of fungi can cause the infection. These fungi are common in moist and warm areas. If your hands or feet come into contact with the fungus, it may get into a crack in your fingernail or toenail and cause the infection. What increases the risk? The following factors may make you more likely to develop this condition:  Being male.  Being of older age.  Living with someone who has the fungus.  Walking barefoot in areas where the fungus thrives, such as showers or locker rooms.  Wearing shoes and socks that cause your feet to sweat.  Having a nail injury or a recent nail surgery.  Having certain medical conditions, such as: ? Athlete's foot. ? Diabetes. ? Psoriasis. ? Poor circulation. ? A weak body defense system (immune system). What are the signs or symptoms? Symptoms of this condition include:  A pale spot on the nail.  Thickening of the nail.  A nail that becomes yellow or brown.  A brittle or ragged nail edge.  A crumbling nail.  A nail that has lifted away from the nail bed.   How is this diagnosed? This condition is diagnosed with a physical exam. Your health care provider may take a scraping or clipping from your nail to test for the fungus. How is this treated? Treatment is not needed for mild infections. If  you have significant nail changes, treatment may include:  Antifungal medicines taken by mouth (orally). You may need to take the medicine for several weeks or several months, and you may not see the results for a long time. These medicines can cause side effects. Ask your health care provider what problems to watch for.  Antifungal nail polish or nail cream. These may be used along with oral antifungal medicines.  Laser treatment of the nail.  Surgery to remove the nail. This may be needed for the most severe infections. It can take a long time, usually up to a year, for the infection to go away. The infection may also come back.   Follow these instructions at home: Medicines  Take or apply over-the-counter and prescription medicines only as told by your health care provider.  Ask your health care provider about using over-the-counter mentholated ointment on your nails. Nail care  Trim your nails often.  Wash and dry your hands and feet every  day.  Keep your feet dry: ? Wear absorbent socks, and change your socks frequently. ? Wear shoes that allow air to circulate, such as sandals or canvas tennis shoes. Throw out old shoes.  Do not use artificial nails.  If you go to a nail salon, make sure you choose one that uses clean instruments.  Use antifungal foot powder on your feet and in your shoes. General instructions  Do not share personal items, such as towels or nail clippers.  Do not walk barefoot in shower rooms or locker rooms.  Wear rubber gloves if you are working with your hands in wet areas.  Keep all follow-up visits as told by your health care provider. This is important. Contact a health care provider if: Your infection is not getting better or it is getting worse after several months. Summary  A fungal nail infection is a common infection of the toenails or fingernails.  Treatment is not needed for mild infections. If you have significant nail changes,  treatment may include taking medicine orally and applying medicine to your nails.  It can take a long time, usually up to a year, for the infection to go away. The infection may also come back.  Take or apply over-the-counter and prescription medicines only as told by your health care provider.  Follow instructions for taking care of your nails to help prevent infection from coming back or spreading. This information is not intended to replace advice given to you by your health care provider. Make sure you discuss any questions you have with your health care provider. Document Revised: 04/24/2018 Document Reviewed: 06/07/2017 Elsevier Patient Education  2021 [employer].

## 2020-04-04 NOTE — Progress Notes (Signed)
  Subjective:  Patient ID: Henry Ewing, male    DOB: Jan 25, 1980,  MRN: [id number]  Chief Complaint  Patient presents with  . Callouses    Bilateral calluses pt stated that they are causing him discomfort     40 y.o. male presents with the above complaint. History confirmed with patient.  These have been very painful for him he wears steel toed shoes.  The most painful is the right foot between the third and fourth toes where the toes rub together, the left foot is in the middle of the forefoot  Objective:  Physical Exam: warm, good capillary refill, no trophic changes or ulcerative lesions, normal DP and PT pulses and normal sensory exam.  Hammertoe contractures 2 through 5 bilaterally.  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 Onychomycosis   2. Pain due to onychomycosis of toenails of both feet   3. Callus of foot   4. Pain   5. Metatarsalgia of both feet   6. Hammertoe of left foot   7. Hammertoe of right foot      Plan:  Patient was evaluated and treated and all questions answered.   Discussed the etiology and treatment options for the condition in detail with the patient. Educated patient on the topical and oral treatment options for mycotic nails. Recommended debridement of the nails today. Sharp and mechanical debridement performed of all painful and mycotic nails today. Nails debrided in length and thickness using a nail nipper to level of comfort. Discussed treatment options including appropriate shoe gear. Follow up as needed for painful nails.  He would like to treat the onychomycosis, I recommended terbinafine 90-day course and sent this to his pharmacy  All symptomatic hyperkeratoses were safely debrided with a sterile #15 blade to patient's level of comfort without incident. We discussed preventative and  palliative care of these lesions including supportive and accommodative shoegear, padding, prefabricated and custom molded accommodative orthoses, use of a pumice stone and lotions/creams daily.  Recommended urea cream for the calluses, we discussed how hammertoes contribute to the formation of these and that arthroplasty could alleviate this as well, and he will consider for the future.  Return in about 3 months (around 07/01/2020), or if symptoms worsen or fail to improve, for painful toenails and callus 2-4.

## 2020-05-20 ENCOUNTER — Encounter: Payer: Self-pay | Admitting: Emergency Medicine

## 2020-05-20 ENCOUNTER — Other Ambulatory Visit: Payer: Self-pay

## 2020-05-20 ENCOUNTER — Ambulatory Visit
Admission: EM | Admit: 2020-05-20 | Discharge: 2020-05-20 | Disposition: A | Discharge status: Home / Self Care | Payer: PRIVATE HEALTH INSURANCE | Attending: Internal Medicine | Admitting: Internal Medicine

## 2020-05-20 DIAGNOSIS — S63501A Unspecified sprain of right wrist, initial encounter: Secondary | ICD-10-CM

## 2020-05-20 MED ORDER — IBUPROFEN 800 MG PO TABS: 800.0000 mg | ORAL_TABLET | Freq: Three times a day (TID) | ORAL | 0 refills | Status: DC | PRN

## 2020-05-20 NOTE — Discharge Instructions (Signed)
Please use the brace when sleeping Take medications as prescribed Rest, icing, gentle range of motion exercises No indication for x-ray at this time.

## 2020-05-20 NOTE — ED Triage Notes (Signed)
Pt here for right wrist pain upon waking 2 days ago; pt denies injury
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
## 2020-07-04 ENCOUNTER — Ambulatory Visit (INDEPENDENT_AMBULATORY_CARE_PROVIDER_SITE_OTHER): Payer: No Typology Code available for payment source | Admitting: Podiatry

## 2020-07-04 ENCOUNTER — Encounter: Payer: Self-pay | Admitting: Podiatry

## 2020-07-04 ENCOUNTER — Other Ambulatory Visit: Payer: Self-pay

## 2020-07-04 DIAGNOSIS — B353 Tinea pedis: Secondary | ICD-10-CM

## 2020-07-04 DIAGNOSIS — Q828 Other specified congenital malformations of skin: Secondary | ICD-10-CM

## 2020-07-04 DIAGNOSIS — R52 Pain, unspecified: Secondary | ICD-10-CM | POA: Diagnosis not present

## 2020-07-04 MED ORDER — KETOCONAZOLE 2 % EX CREA: 1.0000 "application " | TOPICAL_CREAM | Freq: Every day | CUTANEOUS | 2 refills | Status: DC

## 2020-07-05 ENCOUNTER — Encounter: Payer: Self-pay | Admitting: Podiatry

## 2020-07-05 NOTE — Progress Notes (Signed)
  Subjective:  Patient ID: Henry Ewing, male    DOB: 08/20/1980,  MRN: [id number]  Chief Complaint  Patient presents with   Callouses    Pt has left foot callus     40 y.o. male presents with the above complaint. History confirmed with patient.  Has not noticed much change in the nails after taking the medication  Objective:  Physical Exam: warm, good capillary refill, no trophic changes or ulcerative lesions, normal DP and PT pulses and normal sensory exam.  Hammertoe contractures 2 through 5 bilaterally.  He has thickened elongated toenails with yellow discoloration and brown changes.  He has scaling dry skin with interdigital tinea pedis Left Foot: Porokeratosis submet 3 Right Foot: Heloma molle lateral fourth and medial third toes  Assessment:   1. Tinea pedis of both feet   2. Porokeratosis   3. Pain      Plan:  Patient was evaluated and treated and all questions answered.  Discussed the etiology and treatment options for tinea pedis.  Discussed topical and oral treatment.  Recommended topical treatment with 2% ketoconazole cream.  This was sent to the patient's pharmacy.  Also discussed appropriate foot hygiene, use of antifungal spray such as Tinactin in shoes, as well as cleaning her foot surfaces such as showers and bathroom floors with bleach.  All symptomatic hyperkeratoses were safely debrided with a sterile #15 blade to patient's level of comfort without incident. We discussed preventative and palliative care of these lesions including supportive and accommodative shoegear, padding, prefabricated and custom molded accommodative orthoses, use of a pumice stone and lotions/creams daily.  Continue urea cream  Return if symptoms worsen or fail to improve.

## 2020-07-25 ENCOUNTER — Other Ambulatory Visit: Payer: Self-pay

## 2020-07-25 ENCOUNTER — Emergency Department: Payer: PRIVATE HEALTH INSURANCE

## 2020-07-25 ENCOUNTER — Emergency Department
Admission: EM | Admit: 2020-07-25 | Discharge: 2020-07-25 | Disposition: A | Discharge status: Home / Self Care | Payer: PRIVATE HEALTH INSURANCE | Attending: Emergency Medicine | Admitting: Emergency Medicine

## 2020-07-25 DIAGNOSIS — M545 Low back pain, unspecified: Secondary | ICD-10-CM | POA: Insufficient documentation

## 2020-07-25 DIAGNOSIS — M940 Chondrocostal junction syndrome [Tietze]: Secondary | ICD-10-CM | POA: Insufficient documentation

## 2020-07-25 DIAGNOSIS — R002 Palpitations: Secondary | ICD-10-CM | POA: Diagnosis not present

## 2020-07-25 DIAGNOSIS — Z87891 Personal history of nicotine dependence: Secondary | ICD-10-CM | POA: Insufficient documentation

## 2020-07-25 DIAGNOSIS — Z20822 Contact with and (suspected) exposure to covid-19: Secondary | ICD-10-CM | POA: Diagnosis not present

## 2020-07-25 DIAGNOSIS — R079 Chest pain, unspecified: Secondary | ICD-10-CM | POA: Diagnosis present

## 2020-07-25 DIAGNOSIS — R0789 Other chest pain: Secondary | ICD-10-CM

## 2020-07-25 LAB — BASIC METABOLIC PANEL
Anion gap: 5 (ref 5–15)
BUN: 14 mg/dL (ref 6–20)
CO2: 28 mmol/L (ref 22–32)
Calcium: 8.9 mg/dL (ref 8.9–10.3)
Chloride: 105 mmol/L (ref 98–111)
Creatinine, Ser: 0.81 mg/dL (ref 0.61–1.24)
GFR, Estimated: >60 mL/min (ref >60)
Glucose, Bld: 109 mg/dL — ABNORMAL HIGH (ref 70–99)
Potassium: 3.9 mmol/L (ref 3.5–5.1)
Sodium: 138 mmol/L (ref 135–145)

## 2020-07-25 LAB — TROPONIN I (HIGH SENSITIVITY): Troponin I (High Sensitivity): 2 ng/L (ref <18)

## 2020-07-25 LAB — CBC
HCT: 40.5 % (ref 39.0–52.0)
Hemoglobin: 13.5 g/dL (ref 13.0–17.0)
MCH: 31.4 pg (ref 26.0–34.0)
MCHC: 33.3 g/dL (ref 30.0–36.0)
MCV: 94.2 fL (ref 80.0–100.0)
Platelets: 240 10*3/uL (ref 150–400)
RBC: 4.3 MIL/uL (ref 4.22–5.81)
RDW: 13.2 % (ref 11.5–15.5)
WBC: 6.3 10*3/uL (ref 4.0–10.5)
nRBC: 0 % (ref 0.0–0.2)

## 2020-07-25 LAB — RESP PANEL BY RT-PCR (FLU A&B, COVID) ARPGX2
Influenza A by PCR: NEGATIVE
Influenza B by PCR: NEGATIVE
SARS Coronavirus 2 by RT PCR: NEGATIVE

## 2020-07-25 MED ORDER — NAPROXEN 375 MG PO TABS: 375.0000 mg | ORAL_TABLET | Freq: Two times a day (BID) | ORAL | 0 refills | Status: AC

## 2020-07-25 MED ORDER — KETOROLAC TROMETHAMINE 60 MG/2ML IM SOLN
60.0000 mg | Freq: Once | INTRAMUSCULAR | Status: DC
  Filled 2020-07-25: qty 2

## 2020-07-25 MED ORDER — PREDNISONE 20 MG PO TABS: 40.0000 mg | ORAL_TABLET | Freq: Every day | ORAL | 0 refills | Status: AC

## 2020-07-25 NOTE — ED Notes (Signed)
Pt to ED c/o SOB all weekend that became worse this morning while at work. Pt states has had high stress in life with recent separation from wife. Pt last saw PCP about 1 year ago and states he wants to establish care with a PCP because family hx DM, HTN. Pt denies hx cardiac, pulmonary issues but does smoke cigars.

## 2020-07-25 NOTE — Discharge Instructions (Addendum)
Take the Naprosyn and Prednisone as prescribed. Take these WITH FOOD. Consider taking an over-the-coutner antacid like Pepcid or Omeprazole with it to prevent indigestion.  Try to be careful with repetitive lifting/twisting at work  After the prescribed medications run out, take ibuprofen 600 mg every 8 hours for pain
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
## 2020-07-25 NOTE — ED Notes (Signed)
Dr Isaacs at bedside 

## 2020-07-25 NOTE — ED Triage Notes (Signed)
Pt c/o tightness in his chest when trying to take a deep breath, states he has a hx of bronchitis. Pt is in NAD , pt is ambulatory to triage with no distress noted.

## 2020-07-25 NOTE — ED Notes (Signed)
Dr Erma Heritage at bedside with pt.

## 2020-07-29 IMAGING — CT CT ABDOMEN AND PELVIS WITH CONTRAST
2 of 4 series · 17 of 46 positions shown, 19 images · IV contrast (omnipaque)
Comparison: None.

CLINICAL DATA: Epigastric pain for 24 hours.

EXAM:
CT ABDOMEN AND PELVIS WITH CONTRAST
TECHNIQUE: Multidetector CT imaging of the abdomen and pelvis was performed
using the standard protocol following bolus administration of
intravenous contrast.
CONTRAST:  100mL OMNIPAQUE IOHEXOL 300 MG/ML  SOLN

[Series 2: axial st · axial · 0.75mm/px · z∈[-592,-127]mm · 14 of 103 slices shown, 16 images]
[im 5/103  soft-tissue]
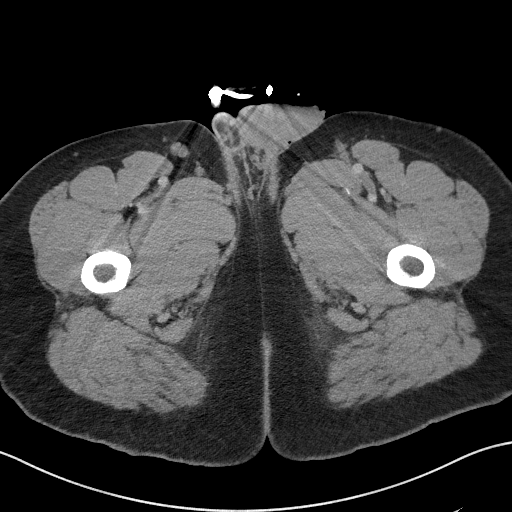
[im 5/103  bone]
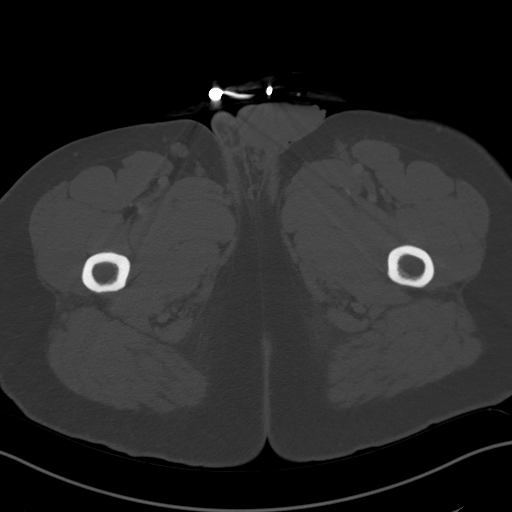
[im 13/103  soft-tissue]
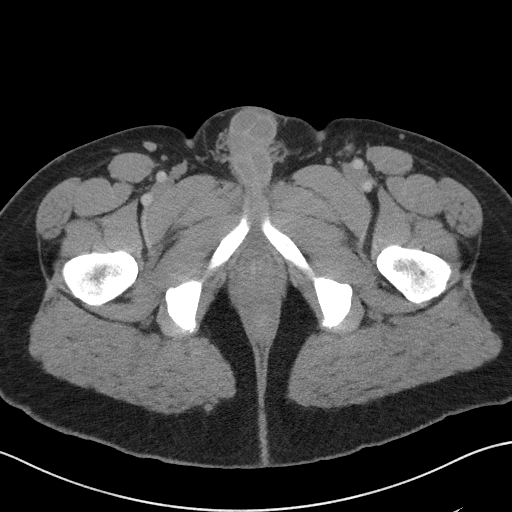
[im 22/103  soft-tissue]
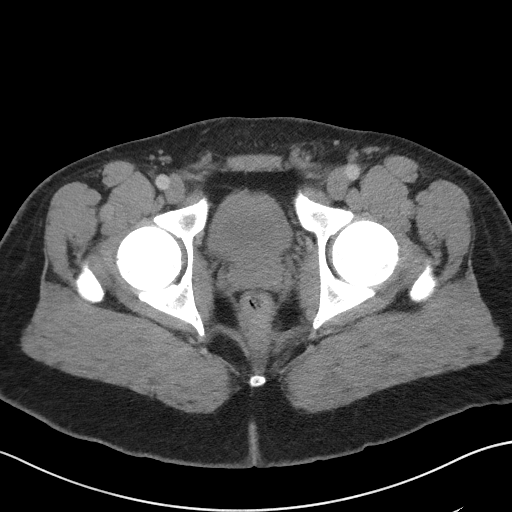
[im 26/103  soft-tissue]
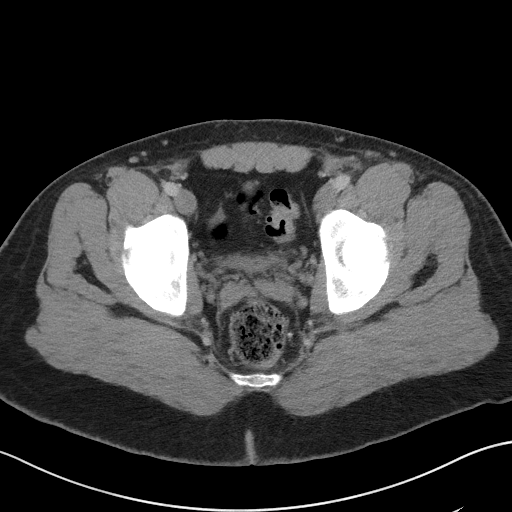
[im 35/103  soft-tissue]
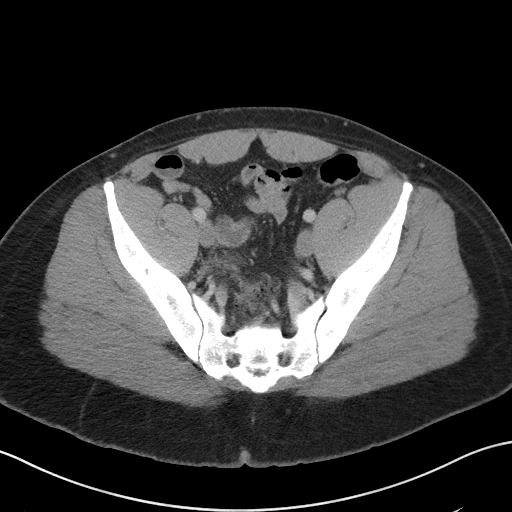
[im 43/103  soft-tissue]
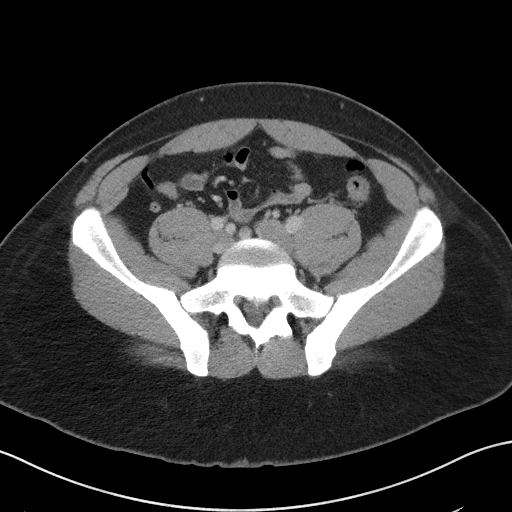
[im 47/103  soft-tissue]
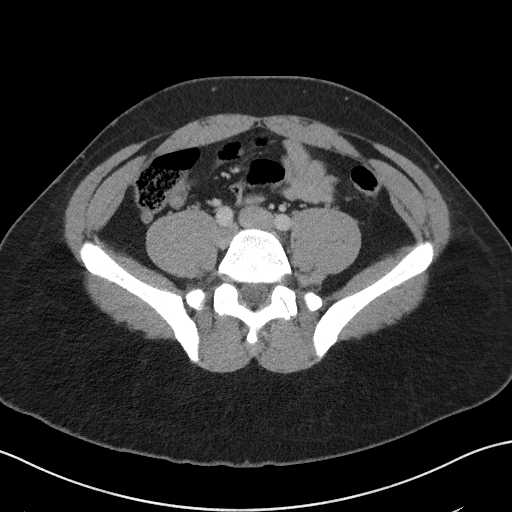
[im 56/103  soft-tissue]
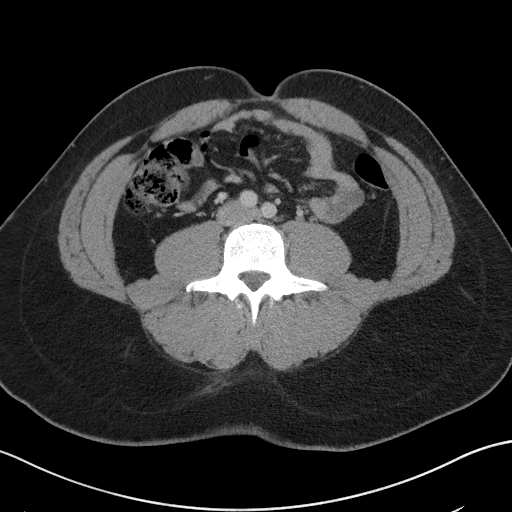
[im 60/103  soft-tissue]
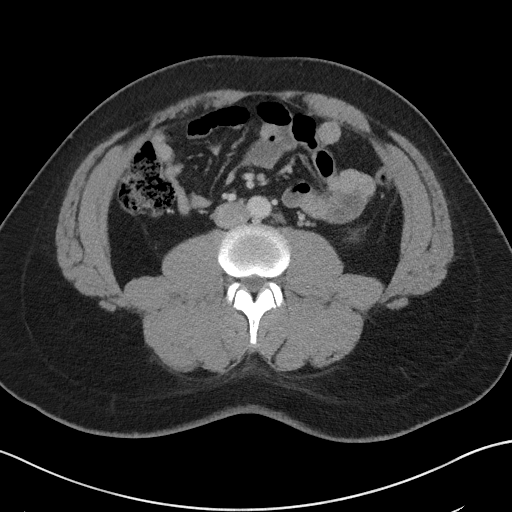
[im 60/103  bone]
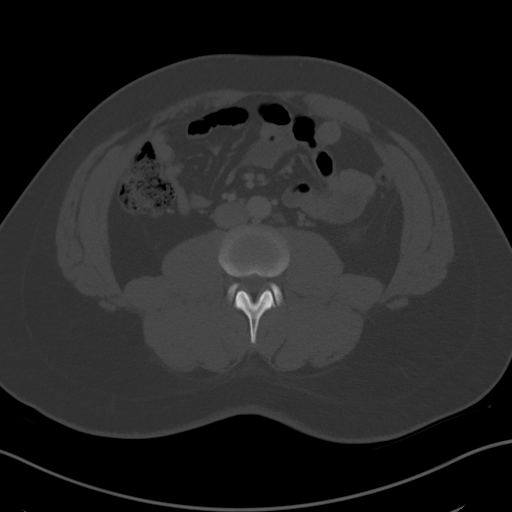
[im 69/103  soft-tissue]
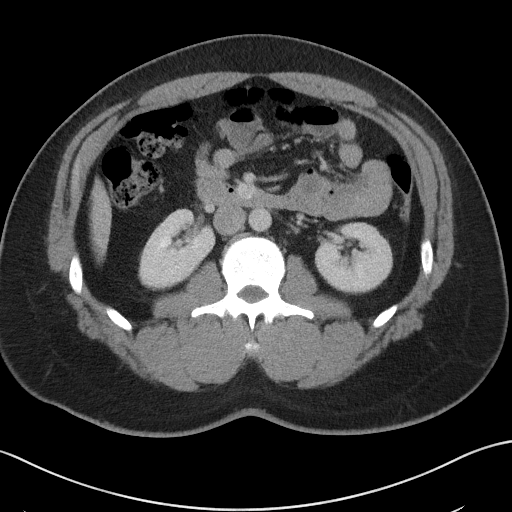
[im 77/103  soft-tissue]
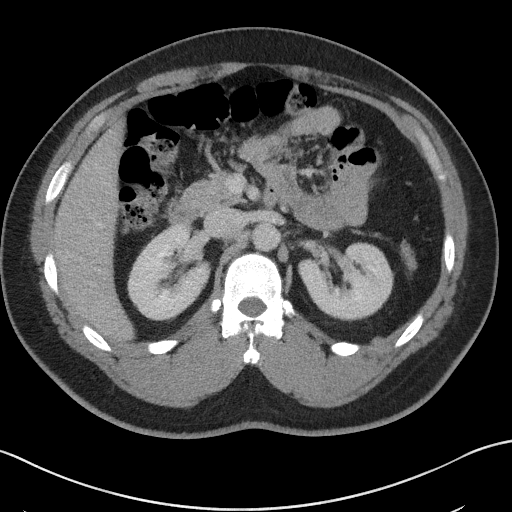
[im 81/103  soft-tissue]
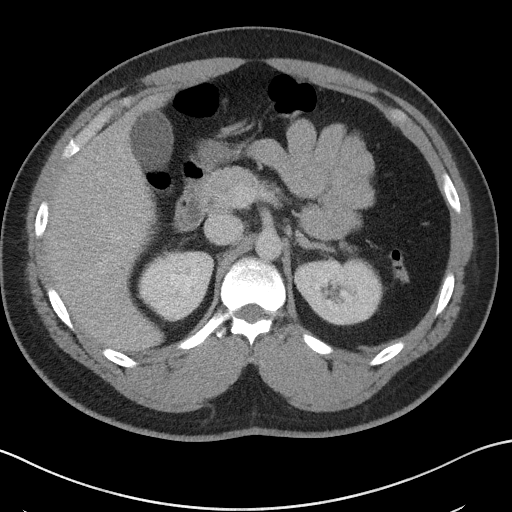
[im 90/103  soft-tissue]
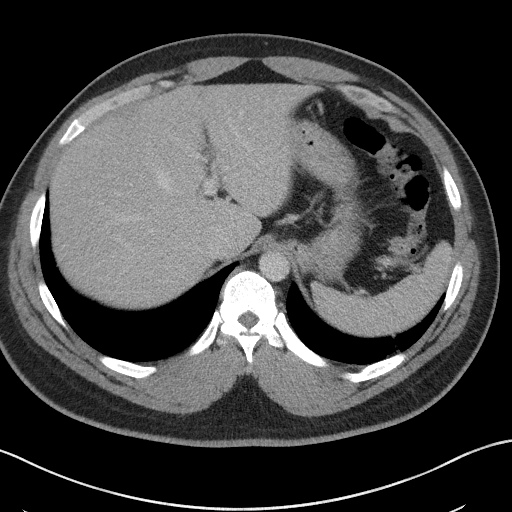
[im 98/103  soft-tissue]
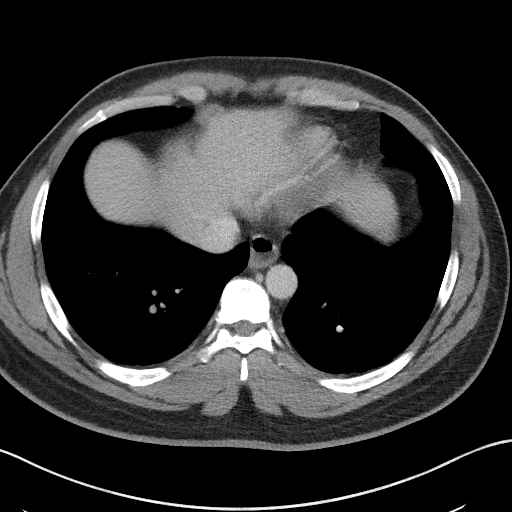

[Series 5: coronal st · coronal · 0.87mm/px · 3 of 111 slices shown]
[im 37/111  soft-tissue]
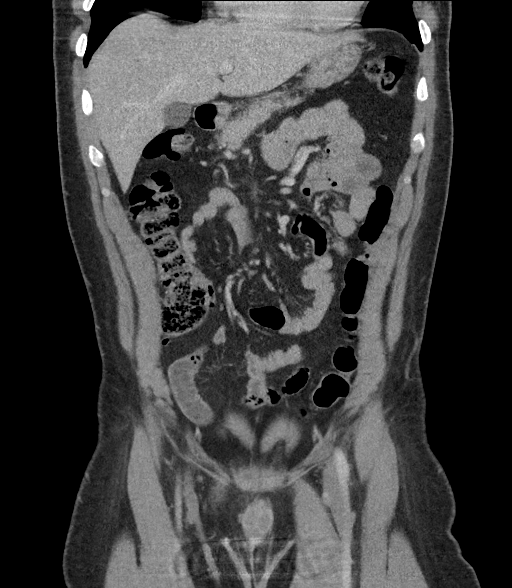
[im 49/111  soft-tissue]
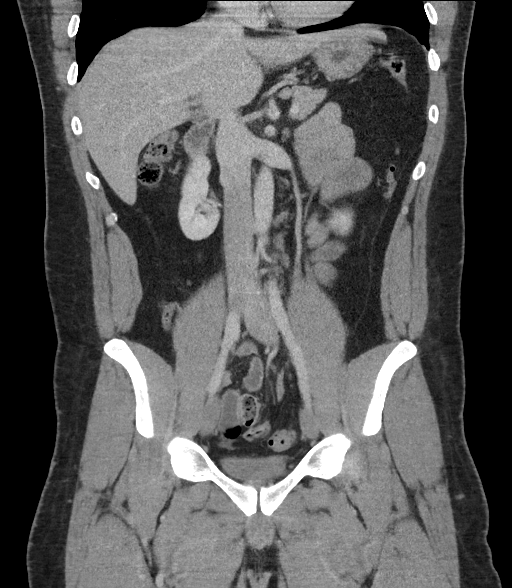
[im 62/111  soft-tissue]
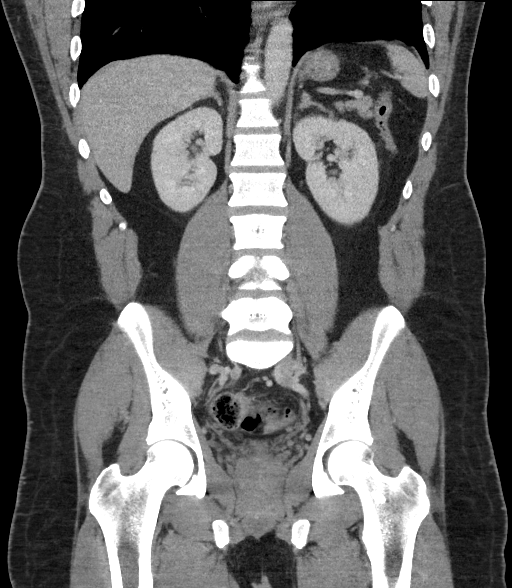

[17 of 46 positions shown; findings below may reference images not displayed]

FINDINGS: Lower chest: Minimal atelectasis of posterior left lung base is
identified.

Hepatobiliary: No focal liver abnormality is seen. No gallstones,
gallbladder wall thickening, or biliary dilatation.

Pancreas: Unremarkable. No pancreatic ductal dilatation or
surrounding inflammatory changes.

Spleen: Normal in size without focal abnormality.

Adrenals/Urinary Tract: Adrenal glands are unremarkable. Kidneys are
normal, without renal calculi, focal lesion, or hydronephrosis.
Bladder is unremarkable.

Stomach/Bowel: Stomach is within normal limits. Appendix appears
normal. No evidence of bowel wall thickening, distention, or
inflammatory changes.

Vascular/Lymphatic: No significant vascular findings are present. No
enlarged abdominal or pelvic lymph nodes.

Reproductive: Prostate is unremarkable.

Other: None.

Musculoskeletal: No acute or significant osseous findings.
IMPRESSION: No acute abnormality identified in the abdomen and pelvis. No
evidence of pancreatitis.

## 2020-08-23 ENCOUNTER — Encounter (HOSPITAL_COMMUNITY): Payer: Self-pay | Admitting: *Deleted

## 2020-08-23 ENCOUNTER — Emergency Department (HOSPITAL_COMMUNITY)
Admission: EM | Admit: 2020-08-23 | Discharge: 2020-08-23 | Disposition: A | Discharge status: Home / Self Care | Payer: PRIVATE HEALTH INSURANCE | Attending: Emergency Medicine | Admitting: Emergency Medicine

## 2020-08-23 ENCOUNTER — Emergency Department (HOSPITAL_COMMUNITY): Payer: PRIVATE HEALTH INSURANCE

## 2020-08-23 ENCOUNTER — Other Ambulatory Visit: Payer: Self-pay

## 2020-08-23 DIAGNOSIS — M79661 Pain in right lower leg: Secondary | ICD-10-CM | POA: Insufficient documentation

## 2020-08-23 DIAGNOSIS — M79604 Pain in right leg: Secondary | ICD-10-CM

## 2020-08-23 DIAGNOSIS — Z87891 Personal history of nicotine dependence: Secondary | ICD-10-CM | POA: Insufficient documentation

## 2020-08-23 DIAGNOSIS — M79662 Pain in left lower leg: Secondary | ICD-10-CM | POA: Diagnosis not present

## 2020-08-23 MED ORDER — IBUPROFEN 600 MG PO TABS: 600.0000 mg | ORAL_TABLET | Freq: Four times a day (QID) | ORAL | 0 refills | Status: AC | PRN

## 2020-08-23 MED ORDER — IBUPROFEN 800 MG PO TABS
800.0000 mg | ORAL_TABLET | Freq: Once | ORAL | Status: AC
  Administered 2020-08-23: 800 mg via ORAL
  Filled 2020-08-23: qty 1
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
## 2020-10-11 ENCOUNTER — Ambulatory Visit (INDEPENDENT_AMBULATORY_CARE_PROVIDER_SITE_OTHER): Payer: No Typology Code available for payment source | Admitting: Podiatry

## 2020-10-11 ENCOUNTER — Other Ambulatory Visit: Payer: Self-pay

## 2020-10-11 DIAGNOSIS — M2041 Other hammer toe(s) (acquired), right foot: Secondary | ICD-10-CM | POA: Diagnosis not present

## 2020-10-11 DIAGNOSIS — M2042 Other hammer toe(s) (acquired), left foot: Secondary | ICD-10-CM | POA: Diagnosis not present

## 2020-10-11 DIAGNOSIS — Q828 Other specified congenital malformations of skin: Secondary | ICD-10-CM | POA: Diagnosis not present

## 2020-10-11 DIAGNOSIS — M7742 Metatarsalgia, left foot: Secondary | ICD-10-CM | POA: Diagnosis not present

## 2020-10-11 DIAGNOSIS — M7741 Metatarsalgia, right foot: Secondary | ICD-10-CM | POA: Diagnosis not present

## 2020-10-12 NOTE — Progress Notes (Signed)
  Subjective:  Patient ID: Henry Ewing, male    DOB: Sep 11, 1980,  MRN: [id number]  Chief Complaint  Patient presents with   Callouses    Follow up left foot callus. Pt states the callus keeps coming back dur to friction. Pt stats he is interested in orthotics.    Foot Orthotics    Pt is interested in orthotics. Size 12 shoe, Weight 250lbs    40 y.o. male presents with the above complaint. History confirmed with patient.  Callus continues to return he says he would be interested in trying orthotics to see if this helps improve it  Objective:  Physical Exam: warm, good capillary refill, no trophic changes or ulcerative lesions, normal DP and PT pulses and normal sensory exam.  Hammertoe contractures 2 through 5 bilaterally.  He has thickened elongated toenails with yellow discoloration and brown changes.   Left Foot: Porokeratosis submet 3 Right Foot: Heloma molle lateral fourth and medial third toes  Assessment:   1. Porokeratosis   2. Metatarsalgia of both feet   3. Hammertoe of left foot   4. Hammertoe of right foot      Plan:  Patient was evaluated and treated and all questions answered.  We discussed offloading the lesion again with custom molded orthoses and he is interested in trying this.  I had him casted for this for an unload on the lesion which was marked.  Hopefully this will offer him some good support and allow him to keep working comfortably and prevent the lesion coming back as significantly.  All symptomatic hyperkeratoses were safely debrided with a sterile #15 blade to patient's level of comfort without incident. We discussed preventative and palliative care of these lesions including supportive and accommodative shoegear, padding, prefabricated and custom molded accommodative orthoses, use of a pumice stone and lotions/creams daily.  Continue urea cream  No follow-ups on file.

## 2021-01-23 ENCOUNTER — Emergency Department (HOSPITAL_COMMUNITY): Payer: BC Managed Care – PPO

## 2021-01-23 ENCOUNTER — Other Ambulatory Visit: Payer: Self-pay

## 2021-01-23 ENCOUNTER — Emergency Department (HOSPITAL_COMMUNITY)
Admission: EM | Admit: 2021-01-23 | Discharge: 2021-01-23 | Disposition: A | Discharge status: Home / Self Care | Payer: BC Managed Care – PPO | Attending: Emergency Medicine | Admitting: Emergency Medicine

## 2021-01-23 ENCOUNTER — Encounter (HOSPITAL_COMMUNITY): Payer: Self-pay

## 2021-01-23 DIAGNOSIS — R079 Chest pain, unspecified: Secondary | ICD-10-CM | POA: Diagnosis not present

## 2021-01-23 DIAGNOSIS — R0789 Other chest pain: Secondary | ICD-10-CM | POA: Diagnosis not present

## 2021-01-23 DIAGNOSIS — R0602 Shortness of breath: Secondary | ICD-10-CM | POA: Diagnosis not present

## 2021-01-23 DIAGNOSIS — R1013 Epigastric pain: Secondary | ICD-10-CM | POA: Diagnosis not present

## 2021-01-23 LAB — COMPREHENSIVE METABOLIC PANEL
ALT: 52 U/L — ABNORMAL HIGH (ref 0–44)
AST: 27 U/L (ref 15–41)
Albumin: 4.2 g/dL (ref 3.5–5.0)
Alkaline Phosphatase: 82 U/L (ref 38–126)
Anion gap: 4 — ABNORMAL LOW (ref 5–15)
BUN: 12 mg/dL (ref 6–20)
CO2: 30 mmol/L (ref 22–32)
Calcium: 9.1 mg/dL (ref 8.9–10.3)
Chloride: 105 mmol/L (ref 98–111)
Creatinine, Ser: 0.91 mg/dL (ref 0.61–1.24)
GFR, Estimated: >60 mL/min (ref >60)
Glucose, Bld: 95 mg/dL (ref 70–99)
Potassium: 3.9 mmol/L (ref 3.5–5.1)
Sodium: 139 mmol/L (ref 135–145)
Total Bilirubin: 0.6 mg/dL (ref 0.3–1.2)
Total Protein: 7.4 g/dL (ref 6.5–8.1)

## 2021-01-23 LAB — CBC WITH DIFFERENTIAL/PLATELET
Abs Immature Granulocytes: 0.02 10*3/uL (ref 0.00–0.07)
Basophils Absolute: 0 10*3/uL (ref 0.0–0.1)
Basophils Relative: 1 %
Eosinophils Absolute: 0.1 10*3/uL (ref 0.0–0.5)
Eosinophils Relative: 2 %
HCT: 44.5 % (ref 39.0–52.0)
Hemoglobin: 14.2 g/dL (ref 13.0–17.0)
Immature Granulocytes: 0 %
Lymphocytes Relative: 45 %
Lymphs Abs: 2.6 10*3/uL (ref 0.7–4.0)
MCH: 30.7 pg (ref 26.0–34.0)
MCHC: 31.9 g/dL (ref 30.0–36.0)
MCV: 96.3 fL (ref 80.0–100.0)
Monocytes Absolute: 0.5 10*3/uL (ref 0.1–1.0)
Monocytes Relative: 9 %
Neutro Abs: 2.4 10*3/uL (ref 1.7–7.7)
Neutrophils Relative %: 43 %
Platelets: 250 10*3/uL (ref 150–400)
RBC: 4.62 MIL/uL (ref 4.22–5.81)
RDW: 13.2 % (ref 11.5–15.5)
WBC: 5.7 10*3/uL (ref 4.0–10.5)
nRBC: 0 % (ref 0.0–0.2)

## 2021-01-23 LAB — TROPONIN I (HIGH SENSITIVITY): Troponin I (High Sensitivity): 2 ng/L (ref <18)

## 2021-01-23 LAB — LIPASE, BLOOD: Lipase: 39 U/L (ref 11–51)

## 2021-01-23 MED ORDER — ALUM & MAG HYDROXIDE-SIMETH 200-200-20 MG/5ML PO SUSP
30.0000 mL | Freq: Once | ORAL | Status: AC
  Administered 2021-01-23: 30 mL via ORAL
  Filled 2021-01-23: qty 30

## 2021-01-23 NOTE — Discharge Instructions (Signed)
Try pepcid or tagamet up to twice a day.  Try to avoid things that may make this worse, most commonly these are spicy foods tomato based products fatty foods chocolate and peppermint.  Alcohol and tobacco can also make this worse.  Return to the emergency department for sudden worsening pain fever or inability to eat or drink.  

## 2021-01-23 NOTE — ED Triage Notes (Addendum)
Patient c/o intermittent left side pain from his head to his legs including chest pain x 2 days. Patient also c/o slight SOB.

## 2021-01-23 NOTE — ED Provider Triage Note (Signed)
Emergency Medicine Provider Triage Evaluation Note  Henry Ewing , a 41 y.o. male  was evaluated in triage.  Pt complains of chest pain and pain to the left side of the body that started 3 days ago. Pain is intermittent. Also has abd pain and sob.  Review of Systems  Positive: Chest pain, abd pain, sob Negative: cough  Physical Exam  BP (!) 175/85 (BP Location: Left Arm)    Pulse 80    Temp 98.1 F (36.7 C) (Oral)    Resp 16    Ht 6\' 3"  (1.905 m)    Wt 117.9 kg    SpO2 95%    BMI 32.50 kg/m  Gen:   Awake, no distress   Resp:  Normal effort  MSK:   Moves extremities without difficulty  Other:  Abd soft, mild epigastric ttp present  Medical Decision Making  Medically screening exam initiated at 12:14 PM.  Appropriate orders placed.  Henry Ewing was informed that the remainder of the evaluation will be completed by another provider, this initial triage assessment does not replace that evaluation, and the importance of remaining in the ED until their evaluation is complete.     , PA-C 01/23/21 1215

## 2021-01-23 NOTE — ED Provider Notes (Signed)
[facility] DEPT Provider Note   CSN: [id number] Arrival date & time: 01/23/21  1121     History  Chief Complaint  Patient presents with   left side pain   Chest Pain    Henry Ewing is a 41 y.o. male.  41 yo M with a chief complaint of chest pain.  This is left-sided started a couple nights ago and then resolved.  He had a period of time where he felt very uncomfortable felt like his whole left side cramped and went tingly.  This is resolved.  He had a training session today at work where they talked about signs and symptoms of heart attack he was concerned that he had some of the symptoms and decided to come in today.  He denies any persistent symptoms but is felt a little bit weak today.  He denies trauma to the area denies cough congestion or fever.  The history is provided by the patient.  Chest Pain Pain location:  L chest Pain quality: sharp   Pain radiates to:  L arm Pain severity:  Moderate Onset quality:  Gradual Duration:  2 days Timing:  Rare Progression:  Resolved Associated symptoms: shortness of breath   Associated symptoms: no abdominal pain, no fever, no headache, no palpitations and no vomiting       Home Medications Prior to Admission medications   Medication Sig Start Date End Date Taking? Authorizing Provider  acetaminophen (TYLENOL) 500 MG tablet Take by mouth.    [provider]  azithromycin (ZITHROMAX) 250 MG tablet  10/03/20   [provider]  benzonatate (TESSALON) 100 MG capsule  10/03/20   [provider]  fexofenadine (ALLEGRA) 180 MG tablet Take by mouth.    [provider]  ketoconazole (NIZORAL) 2 % cream Apply 1 application topically daily. 07/04/20   Criselda Peaches, DPM  naproxen (NAPROSYN) 250 MG tablet Take by mouth. 05/26/15   [provider]  traMADol (ULTRAM) 50 MG tablet Take by mouth.    [provider]      Allergies    Patient has no  known allergies.    Review of Systems   Review of Systems  Constitutional:  Negative for chills and fever.  HENT:  Negative for congestion and facial swelling.   Eyes:  Negative for discharge and visual disturbance.  Respiratory:  Positive for shortness of breath.   Cardiovascular:  Positive for chest pain. Negative for palpitations.  Gastrointestinal:  Negative for abdominal pain, diarrhea and vomiting.  Musculoskeletal:  Negative for arthralgias and myalgias.  Skin:  Negative for color change and rash.  Neurological:  Negative for tremors, syncope and headaches.  Psychiatric/Behavioral:  Negative for confusion and dysphoric mood.    Physical Exam Updated Vital Signs BP 138/66 (BP Location: Left Arm)    Pulse (!) 58    Temp 98.2 F (36.8 C) (Oral)    Resp 18    Ht 6\' 3"  (1.905 m)    Wt 117.9 kg    SpO2 100%    BMI 32.50 kg/m  Physical Exam Vitals and nursing note reviewed.  Constitutional:      Appearance: He is well-developed.  HENT:     Head: Normocephalic and atraumatic.  Eyes:     Pupils: Pupils are equal, round, and reactive to light.  Neck:     Vascular: No JVD.  Cardiovascular:     Rate and Rhythm: Normal rate and regular rhythm.  Heart sounds: No murmur heard.   No friction rub. No gallop.  Pulmonary:     Effort: No respiratory distress.     Breath sounds: No wheezing.  Abdominal:     General: There is no distension.     Tenderness: There is abdominal tenderness (mild epigatric). There is no guarding or rebound.  Musculoskeletal:        General: Normal range of motion.     Cervical back: Normal range of motion and neck supple.  Skin:    Coloration: Skin is not pale.     Findings: No rash.  Neurological:     Mental Status: He is alert and oriented to person, place, and time.  Psychiatric:        Behavior: Behavior normal.    ED Results / Procedures / Treatments   Labs (all labs ordered are listed, but only abnormal results are displayed) Labs Reviewed   COMPREHENSIVE METABOLIC PANEL - Abnormal; Notable for the following components:      Result Value   ALT 52 (*)    Anion gap 4 (*)    All other components within normal limits  CBC WITH DIFFERENTIAL/PLATELET  LIPASE, BLOOD  TROPONIN I (HIGH SENSITIVITY)  TROPONIN I (HIGH SENSITIVITY)    EKG EKG Interpretation  Date/Time:  Monday January 23 2021 11:54:33 EST Ventricular Rate:  78 PR Interval:  157 QRS Duration: 110 QT Interval:  358 QTC Calculation: 408 R Axis:   77 Text Interpretation: Sinus rhythm Low voltage, precordial leads Abnormal R-wave progression, early transition Minimal ST elevation, inferior leads Baseline wander in lead(s) V1 V4 No significant change since last tracing Confirmed by Deno Etienne [phone]) on 01/23/2021 7:07:11 PM  Radiology DG Chest 2 View  Result Date: 01/23/2021 CLINICAL DATA:  Chest pain EXAM: CHEST - 2 VIEW COMPARISON:  07/25/2020 FINDINGS: The heart size and mediastinal contours are within normal limits. Both lungs are clear. The visualized skeletal structures are unremarkable. IMPRESSION: No active cardiopulmonary disease. Electronically Signed   By: Davina Poke D.O.   On: 01/23/2021 12:32    Procedures Procedures    Medications Ordered in ED Medications  alum & mag hydroxide-simeth (MAALOX/MYLANTA) 200-200-20 MG/5ML suspension 30 mL (has no administration in time range)    ED Course/ Medical Decision Making/ A&P                           Medical Decision Making  41 yo M with a chief complaints of chest pain.  This is atypical in nature and actually occurred about 24 to 48 hours ago.  Has essentially resolved.  His troponin here is negative EKG without concerning finding chest x-ray viewed by me without focal infiltrate or pneumothorax.  No significant electrolyte abnormality no significant anemia.  There is likely reflux based on his history.  He later told me that he had some nodules with jalapenos on them right before his discomfort.   We will have him trial H2 blockers.  PCP follow-up.  7:46 PM:  I have discussed the diagnosis/risks/treatment options with the patient and believe the pt to be eligible for discharge home to follow-up with PCP. We also discussed returning to the ED immediately if new or worsening sx occur. We discussed the sx which are most concerning (e.g., sudden worsening pain, fever, inability to tolerate by mouth) that necessitate immediate return. Medications administered to the patient during their visit and any new prescriptions provided to the patient are listed  below.  Medications given during this visit Medications  alum & mag hydroxide-simeth (MAALOX/MYLANTA) 200-200-20 MG/5ML suspension 30 mL (has no administration in time range)     The patient appears reasonably screen and/or stabilized for discharge and I doubt any other medical condition or other [facility] requiring further screening, evaluation, or treatment in the ED at this time prior to discharge.          Final Clinical Impression(s) / ED Diagnoses Final diagnoses:  Nonspecific chest pain    Rx / DC Orders ED Discharge Orders     None         Deno Etienne, DO 01/23/21 1946

## 2021-02-24 ENCOUNTER — Encounter: Payer: Self-pay | Admitting: Internal Medicine

## 2021-02-24 ENCOUNTER — Ambulatory Visit (INDEPENDENT_AMBULATORY_CARE_PROVIDER_SITE_OTHER): Payer: BC Managed Care – PPO | Admitting: Internal Medicine

## 2021-02-24 VITALS — BP 128/66 | HR 88 | Temp 98.4°F | Wt 277.6 lb

## 2021-02-24 DIAGNOSIS — Z7689 Persons encountering health services in other specified circumstances: Secondary | ICD-10-CM | POA: Insufficient documentation

## 2021-02-24 DIAGNOSIS — S81832D Puncture wound without foreign body, left lower leg, subsequent encounter: Secondary | ICD-10-CM

## 2021-02-24 DIAGNOSIS — Z6834 Body mass index (BMI) 34.0-34.9, adult: Secondary | ICD-10-CM | POA: Diagnosis not present

## 2021-02-24 DIAGNOSIS — M79669 Pain in unspecified lower leg: Secondary | ICD-10-CM

## 2021-02-24 DIAGNOSIS — Z9189 Other specified personal risk factors, not elsewhere classified: Secondary | ICD-10-CM | POA: Diagnosis not present

## 2021-02-24 DIAGNOSIS — Z0000 Encounter for general adult medical examination without abnormal findings: Secondary | ICD-10-CM

## 2021-02-24 DIAGNOSIS — Z13228 Encounter for screening for other metabolic disorders: Secondary | ICD-10-CM | POA: Insufficient documentation

## 2021-02-24 DIAGNOSIS — Z1159 Encounter for screening for other viral diseases: Secondary | ICD-10-CM | POA: Diagnosis not present

## 2021-02-24 DIAGNOSIS — S81832A Puncture wound without foreign body, left lower leg, initial encounter: Secondary | ICD-10-CM | POA: Insufficient documentation

## 2021-02-24 DIAGNOSIS — E669 Obesity, unspecified: Secondary | ICD-10-CM

## 2021-02-24 DIAGNOSIS — I872 Venous insufficiency (chronic) (peripheral): Secondary | ICD-10-CM

## 2021-02-24 DIAGNOSIS — F419 Anxiety disorder, unspecified: Secondary | ICD-10-CM | POA: Diagnosis not present

## 2021-02-24 DIAGNOSIS — F1290 Cannabis use, unspecified, uncomplicated: Secondary | ICD-10-CM

## 2021-02-24 LAB — POCT GLYCOSYLATED HEMOGLOBIN (HGB A1C): Hemoglobin A1C: 4.9 % (ref 4.0–5.6)

## 2021-02-24 LAB — GLUCOSE, CAPILLARY: Glucose-Capillary: 107 mg/dL — ABNORMAL HIGH (ref 70–99)

## 2021-02-24 NOTE — Assessment & Plan Note (Signed)
Lipid panel and A1c obtained today given BMI. A1c WNL.  - f/u lipid panel - encouraged healthy diet and exercise/lifestyle modifications

## 2021-02-24 NOTE — Assessment & Plan Note (Signed)
Patient denies interference with his life or ability to complete his responsibilities despite daily use. Says that he often uses marijuana to help him fall asleep because of his anxiety/relationship difficulties. - continue cessation counseling - referral to counseling
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
## 2021-02-24 NOTE — Assessment & Plan Note (Signed)
Patient reports difficulty falling asleep, feeling irritable, and having a lot of worry about life and his current relationship. Previously was in therapy but was lost to follow up during [place] - referral to integrated behavioral health
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
## 2021-02-24 NOTE — Assessment & Plan Note (Signed)
-  HCV screening today 

## 2021-02-24 NOTE — Patient Instructions (Signed)
Henry Ewing  It was a pleasure seeing you in the clinic today.   We talked about life stress, your leg swelling, anxiety, and your overall health.  Leg swelling- continue to wear compression stockings during the day and elevate your legs at night. This will help the swelling drain out of your legs.  Leg pain- I am going to refer you to physical therapy Anxiety- I am going to refer you to see our therapist here in clinic.  Please call our clinic at [phone] if you have any questions or concerns. The best time to call is Monday-Friday from 9am-4pm, but there is someone available 24/7 at the same number. If you need medication refills, please notify your pharmacy one week in advance and they will send [place] a request.   Thank you for letting us take part in your care. We look forward to seeing you next time!
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
## 2021-02-25 LAB — LIPID PANEL
Chol/HDL Ratio: 5.1 ratio — ABNORMAL HIGH (ref 0.0–5.0)
Cholesterol, Total: 187 mg/dL (ref 100–199)
HDL: 37 mg/dL — ABNORMAL LOW (ref >39)
LDL Chol Calc (NIH): 116 mg/dL — ABNORMAL HIGH (ref 0–99)
Triglycerides: 195 mg/dL — ABNORMAL HIGH (ref 0–149)
VLDL Cholesterol Cal: 34 mg/dL (ref 5–40)

## 2021-02-25 LAB — HCV AB W REFLEX TO QUANT PCR: HCV Ab: <0.1 s/co ratio (ref 0.0–0.9)

## 2021-02-25 LAB — HCV INTERPRETATION

## 2021-02-27 NOTE — Progress Notes (Signed)
Internal Medicine Clinic Attending ° °Case discussed with Dr. DeMaio  At the time of the visit.  We reviewed the resident’s history and exam and pertinent patient test results.  I agree with the assessment, diagnosis, and plan of care documented in the resident’s note. ° ° °

## 2021-03-02 ENCOUNTER — Encounter: Payer: Self-pay | Admitting: Internal Medicine

## 2021-03-09 ENCOUNTER — Ambulatory Visit: Payer: BC Managed Care – PPO | Attending: Internal Medicine

## 2021-03-09 ENCOUNTER — Other Ambulatory Visit: Payer: Self-pay

## 2021-03-09 DIAGNOSIS — S81832D Puncture wound without foreign body, left lower leg, subsequent encounter: Secondary | ICD-10-CM | POA: Diagnosis not present

## 2021-03-09 DIAGNOSIS — M79604 Pain in right leg: Secondary | ICD-10-CM | POA: Diagnosis not present

## 2021-03-09 DIAGNOSIS — M6281 Muscle weakness (generalized): Secondary | ICD-10-CM | POA: Diagnosis not present

## 2021-03-09 DIAGNOSIS — M79605 Pain in left leg: Secondary | ICD-10-CM | POA: Diagnosis not present
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
## 2021-03-14 ENCOUNTER — Other Ambulatory Visit: Payer: Self-pay

## 2021-03-14 ENCOUNTER — Ambulatory Visit: Payer: BC Managed Care – PPO

## 2021-03-14 DIAGNOSIS — M6281 Muscle weakness (generalized): Secondary | ICD-10-CM

## 2021-03-14 DIAGNOSIS — M79605 Pain in left leg: Secondary | ICD-10-CM | POA: Diagnosis not present

## 2021-03-14 DIAGNOSIS — M79604 Pain in right leg: Secondary | ICD-10-CM | POA: Diagnosis not present

## 2021-03-14 DIAGNOSIS — S81832D Puncture wound without foreign body, left lower leg, subsequent encounter: Secondary | ICD-10-CM | POA: Diagnosis not present
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
## 2021-03-16 ENCOUNTER — Ambulatory Visit: Payer: BC Managed Care – PPO | Attending: Internal Medicine

## 2021-03-16 ENCOUNTER — Other Ambulatory Visit: Payer: Self-pay

## 2021-03-16 DIAGNOSIS — M6281 Muscle weakness (generalized): Secondary | ICD-10-CM

## 2021-03-16 DIAGNOSIS — M79604 Pain in right leg: Secondary | ICD-10-CM | POA: Insufficient documentation

## 2021-03-16 DIAGNOSIS — M79605 Pain in left leg: Secondary | ICD-10-CM
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
## 2021-03-21 ENCOUNTER — Other Ambulatory Visit: Payer: Self-pay

## 2021-03-21 ENCOUNTER — Ambulatory Visit: Payer: BC Managed Care – PPO

## 2021-03-21 DIAGNOSIS — M79604 Pain in right leg: Secondary | ICD-10-CM | POA: Diagnosis not present

## 2021-03-21 DIAGNOSIS — M6281 Muscle weakness (generalized): Secondary | ICD-10-CM

## 2021-03-21 DIAGNOSIS — M79605 Pain in left leg: Secondary | ICD-10-CM
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
## 2021-03-22 ENCOUNTER — Ambulatory Visit: Payer: BC Managed Care – PPO | Admitting: Behavioral Health

## 2021-03-22 DIAGNOSIS — F419 Anxiety disorder, unspecified: Secondary | ICD-10-CM

## 2021-03-22 DIAGNOSIS — F331 Major depressive disorder, recurrent, moderate: Secondary | ICD-10-CM
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
## 2021-03-23 ENCOUNTER — Ambulatory Visit: Payer: BC Managed Care – PPO

## 2021-03-28 ENCOUNTER — Ambulatory Visit: Payer: BC Managed Care – PPO

## 2021-03-28 ENCOUNTER — Other Ambulatory Visit: Payer: Self-pay

## 2021-03-28 DIAGNOSIS — M6281 Muscle weakness (generalized): Secondary | ICD-10-CM

## 2021-03-28 DIAGNOSIS — M79605 Pain in left leg: Secondary | ICD-10-CM

## 2021-03-28 DIAGNOSIS — M79604 Pain in right leg: Secondary | ICD-10-CM | POA: Diagnosis not present
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
## 2021-03-30 ENCOUNTER — Other Ambulatory Visit: Payer: Self-pay

## 2021-03-30 ENCOUNTER — Ambulatory Visit: Payer: BC Managed Care – PPO

## 2021-03-30 DIAGNOSIS — M6281 Muscle weakness (generalized): Secondary | ICD-10-CM

## 2021-03-30 DIAGNOSIS — M79604 Pain in right leg: Secondary | ICD-10-CM

## 2021-03-30 DIAGNOSIS — M79605 Pain in left leg: Secondary | ICD-10-CM
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
## 2021-04-04 ENCOUNTER — Other Ambulatory Visit: Payer: Self-pay

## 2021-04-04 ENCOUNTER — Ambulatory Visit: Payer: BC Managed Care – PPO

## 2021-04-04 DIAGNOSIS — M79605 Pain in left leg: Secondary | ICD-10-CM | POA: Diagnosis not present

## 2021-04-04 DIAGNOSIS — M6281 Muscle weakness (generalized): Secondary | ICD-10-CM

## 2021-04-04 DIAGNOSIS — M79604 Pain in right leg: Secondary | ICD-10-CM | POA: Diagnosis not present
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
## 2021-04-06 ENCOUNTER — Ambulatory Visit: Payer: BC Managed Care – PPO
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
## 2021-04-11 ENCOUNTER — Other Ambulatory Visit: Payer: Self-pay

## 2021-04-11 ENCOUNTER — Ambulatory Visit: Payer: BC Managed Care – PPO

## 2021-04-11 DIAGNOSIS — M6281 Muscle weakness (generalized): Secondary | ICD-10-CM

## 2021-04-11 DIAGNOSIS — M79605 Pain in left leg: Secondary | ICD-10-CM | POA: Diagnosis not present

## 2021-04-11 DIAGNOSIS — M79604 Pain in right leg: Secondary | ICD-10-CM
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
## 2021-04-13 ENCOUNTER — Ambulatory Visit: Payer: BC Managed Care – PPO
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
## 2021-04-18 ENCOUNTER — Institutional Professional Consult (permissible substitution): Payer: BC Managed Care – PPO | Admitting: Behavioral Health

## 2021-05-02 ENCOUNTER — Ambulatory Visit: Payer: BC Managed Care – PPO | Attending: Internal Medicine

## 2021-05-02 DIAGNOSIS — M79604 Pain in right leg: Secondary | ICD-10-CM

## 2021-05-02 DIAGNOSIS — M6281 Muscle weakness (generalized): Secondary | ICD-10-CM | POA: Diagnosis not present

## 2021-05-02 DIAGNOSIS — M79605 Pain in left leg: Secondary | ICD-10-CM | POA: Diagnosis not present
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
## 2021-05-11 ENCOUNTER — Ambulatory Visit: Payer: BC Managed Care – PPO

## 2021-05-11 ENCOUNTER — Institutional Professional Consult (permissible substitution): Payer: BC Managed Care – PPO | Admitting: Behavioral Health
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
## 2021-05-12 ENCOUNTER — Telehealth: Payer: Self-pay

## 2021-05-12 NOTE — Telephone Encounter (Signed)
Several call made regarding missed appointment on 05/11/21, line busy, unable to leave VM ?

## 2021-05-16 ENCOUNTER — Ambulatory Visit: Payer: BC Managed Care – PPO | Attending: Internal Medicine

## 2021-05-18 ENCOUNTER — Ambulatory Visit: Payer: BC Managed Care – PPO

## 2021-05-22 ENCOUNTER — Other Ambulatory Visit: Payer: Self-pay

## 2021-05-22 ENCOUNTER — Encounter: Payer: Self-pay | Admitting: Internal Medicine

## 2021-05-22 ENCOUNTER — Other Ambulatory Visit (HOSPITAL_COMMUNITY)
Admission: RE | Admit: 2021-05-22 | Discharge: 2021-05-22 | Disposition: A | Discharge status: Home / Self Care | Payer: BC Managed Care – PPO | Source: Ambulatory Visit | Attending: Internal Medicine | Admitting: Internal Medicine

## 2021-05-22 ENCOUNTER — Ambulatory Visit: Payer: BC Managed Care – PPO | Admitting: Behavioral Health

## 2021-05-22 ENCOUNTER — Ambulatory Visit (INDEPENDENT_AMBULATORY_CARE_PROVIDER_SITE_OTHER): Payer: BC Managed Care – PPO | Admitting: Internal Medicine

## 2021-05-22 VITALS — BP 117/55 | HR 75 | Temp 98.6°F | Ht 75.0 in | Wt 272.6 lb

## 2021-05-22 DIAGNOSIS — R350 Frequency of micturition: Secondary | ICD-10-CM | POA: Diagnosis not present

## 2021-05-22 DIAGNOSIS — M549 Dorsalgia, unspecified: Secondary | ICD-10-CM | POA: Diagnosis not present

## 2021-05-22 DIAGNOSIS — F419 Anxiety disorder, unspecified: Secondary | ICD-10-CM | POA: Diagnosis not present

## 2021-05-22 DIAGNOSIS — K589 Irritable bowel syndrome without diarrhea: Secondary | ICD-10-CM

## 2021-05-22 DIAGNOSIS — F331 Major depressive disorder, recurrent, moderate: Secondary | ICD-10-CM

## 2021-05-22 LAB — POCT URINALYSIS DIPSTICK
Bilirubin, UA: NEGATIVE
Glucose, UA: NEGATIVE
Ketones, UA: NEGATIVE
Leukocytes, UA: NEGATIVE
Nitrite, UA: NEGATIVE
Protein, UA: NEGATIVE
Spec Grav, UA: 1.02 (ref 1.010–1.025)
Urobilinogen, UA: 0.2 E.U./dL
pH, UA: 6 (ref 5.0–8.0)

## 2021-05-22 NOTE — BH Specialist Note (Signed)
Integrated Behavioral Health Follow Up In-Person Visit ? ?MRN: [id number] ?Name: Henry Ewing ? ?Number of [facility] Clinician visits: 2 ?Session Start time: 1000 ?Session End time: 1100 ?Total time in minutes: 60 min ? ?Types of Service: Individual psychotherapy ? ?Interpretor:No. Interpretor Name and Language: n/a ? ?Subjective: ?Henry Ewing is a 41 y.o. male accompanied by  self ?Patient was referred by Dr. Ileene Musa, MD for mental health wellbeing. ?Patient reports the following symptoms/concerns: uncertainty re: the health of his relationship w/his current GF with whom he lives; they have communication & financial issues which Pt is addressing well. Pt is challenged to create a new life for himself where he can care for his 20yo Son who is on the Attention Spectrum w/Autism. He attends a special school run by [employer].  ? ?Pt is trying to create a life where he can share financials w/his GF; he is teaching her new ways to save money. ? ?Pt c/o issues w/IBS & his GAD-7 & PHQ-9 screeners are unremarkable today. Provided psychoedu re: IBS & its relationship to anx. ? ?Pt is working 2 jobs M-F & every other Sat to save money for his plans. ?Duration of problem: months; Severity of problem: mild to moderate ? ?Objective: ?Mood: Euthymic and Affect: Appropriate ?Risk of harm to self or others: No plan to harm self or others ? 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  ?School/Work: Pt does not attend [employer] earned his GED while incarcerated & did everything in his power to reduce his orig sentence from 5-7 yrs to release in 5 yrs. ? ?Pt has 2 songs on [employer] & has a Statistician business. ? ?Self-Care: Pt is attending therapy for a Professional perspective. He has attended mandated Therapy in the past @ [facility] & found this helpful ?Life Changes: Pt is forging a new life w/his GF &  wants positive things for the relationship. He wants psychoedu & tools for managing his relationship in the best manner possible.  ? ?Patient and/or Family's Strengths/Protective Factors: ?Social and Emotional competence, Concrete supports in place (healthy food, safe environments, etc.), Sense of purpose, Physical Health (exercise, healthy diet, medication compliance, etc.), and Caregiver has knowledge of parenting & child development. Pt is a resilient person w/many ideas for his future. He is respectful & compassionate towards others & himself. ? ?Goals Addressed: ?Patient will: ? Reduce symptoms of: anxiety and stress  ? Increase knowledge and/or ability of: coping skills, healthy habits, and stress reduction  ? Demonstrate ability to: Increase healthy adjustment to current life circumstances ? ?Progress towards Goals: ?Ongoing ? ?Interventions: ?Interventions utilized:  Solution-Focused Strategies and Supportive Counseling ?Standardized Assessments completed:  screeners prn ? ?Patient and/or Family Response: Pt is responsive in visit today & has a positive attitude about his future & his relationship. ? ?Patient Centered Plan: ?Patient is on the following Treatment Plan(s): Examine & remind yourself how far you have come since 2003 & the orig adjudication sentence. You have molded yourself into a good Citizen & caring Father.  ?Assessment: ?Patient currently experiencing elevated anx/dep with resulting discomfort in his gut.  ? ?Patient may benefit from cont'd Cslg & basic tools for anx. ? ?Plan: ?Follow up with behavioral health clinician on : 2-3 wks for 60 min on telehealth ?Behavioral recommendations: You are on the right track-just keep your attitude & behs congruent as  we discussed & maintain your pers priorities. ?Referral(s): [place] (In Clinic) ?"From scale of 1-10, how likely are you to follow plan?": 8 ? ?Donnetta Hutching, LMFT ? ? ?

## 2021-05-22 NOTE — Assessment & Plan Note (Signed)
Patient complaining of several days of back pain on his left side. Tender along the paraspinal muscles of the mid back worse on the left than the right. No CVA tenderness, fever, chills, or bacteria on UA. Suspect this is MSK related. Recommend conservative management with heating pads and tylenol. ?- CTM ?

## 2021-05-22 NOTE — Patient Instructions (Addendum)
Henry Ewing ? ?It was a pleasure seeing you in the clinic today.  ? ?We talked about your back pain, your urinary frequency, your mood, and your bowel habits ? ?Diet- try cutting dairy out of your diet and see if this improves your symptoms. You can also keep a food diary to help you figure out what is causing your symptoms ? ?Urinary frequency- I will call you with the results of the tests when they come back ? ?Itching- try using an unscented soap ? ?Please call our clinic at [phone] if you have any questions or concerns. The best time to call is Monday-Friday from 9am-4pm, but there is someone available 24/7 at the same number. If you need medication refills, please notify your pharmacy one week in advance and they will send [place] a request. ?  ?Thank you for letting us take part in your care. We look forward to seeing you next time! ? ?

## 2021-05-22 NOTE — Assessment & Plan Note (Signed)
Patient complaining of urinary frequency over the last three to four days. Does have a history of STI in the recent past, no new sexual partners. He would like STI testing today. No dysuria or discharge symptoms. No change in urine color, hematuria, fever, or chills. UA today did show some trace blood. No suprapubic tenderness or CVA tenderness on exam. ? ?Plan: ?- STI screening today ?- will need to f/u on hematuria if STI tests come back negative ?
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
## 2021-05-22 NOTE — Assessment & Plan Note (Signed)
Patient complaining of cramping and abdominal discomfort leading up to a bowel movement. Pain improves after BM. He endorses a history of lactose intolerance but still eats some dairy in his diet. Recommend conservative management with eliminating dairy and keeping a food diary to try and determine if there are any other identifiable triggers. No fever, chills, bloody or dark tarry stools. ?- IBS handouts provided ?- avoid dairy ?- high fiber diet ?- f/u symptoms at next visit ?

## 2021-05-22 NOTE — Assessment & Plan Note (Signed)
Patient says his mood is up and down, denies SI. Has been seeing Dr. Monna Fam, first in person appointment with her is today. He is happy with how things are going. ?- continue counseling ?

## 2021-05-23 ENCOUNTER — Ambulatory Visit: Payer: BC Managed Care – PPO

## 2021-05-23 LAB — URINE CYTOLOGY ANCILLARY ONLY
Chlamydia: NEGATIVE
Comment: NEGATIVE
Comment: NEGATIVE
Comment: NORMAL
Neisseria Gonorrhea: NEGATIVE
Trichomonas: NEGATIVE

## 2021-05-23 LAB — HIV ANTIBODY (ROUTINE TESTING W REFLEX): HIV Screen 4th Generation wRfx: NONREACTIVE

## 2021-05-23 LAB — RPR: RPR Ser Ql: NONREACTIVE

## 2021-05-23 NOTE — Progress Notes (Signed)
Internal Medicine Clinic Attending ° °Case discussed with Dr. DeMaio  At the time of the visit.  We reviewed the resident’s history and exam and pertinent patient test results.  I agree with the assessment, diagnosis, and plan of care documented in the resident’s note. ° ° °

## 2021-05-25 ENCOUNTER — Ambulatory Visit: Payer: BC Managed Care – PPO

## 2021-05-30 ENCOUNTER — Ambulatory Visit: Payer: No Typology Code available for payment source

## 2021-05-30 DIAGNOSIS — M7741 Metatarsalgia, right foot: Secondary | ICD-10-CM

## 2021-05-30 DIAGNOSIS — M2041 Other hammer toe(s) (acquired), right foot: Secondary | ICD-10-CM

## 2021-05-30 DIAGNOSIS — M2042 Other hammer toe(s) (acquired), left foot: Secondary | ICD-10-CM

## 2021-05-30 NOTE — Progress Notes (Signed)
SITUATION: ?Reason for Visit: Fitting and Delivery of Custom Fabricated Foot Orthoses ?Patient Report: Patient reports comfort and is satisfied with device. ? ?OBJECTIVE DATA: ?Patient History / Diagnosis:   ?  ICD-10-CM   ?1. Metatarsalgia of both feet  M77.41   ? M77.42   ?  ?2. Hammertoe of left foot  M20.42   ?  ?3. Hammertoe of right foot  M20.41   ?  ? ? ?Provided Device:  Custom Functional Foot Orthotics ?    [employer]: [id number] ? ?GOAL OF ORTHOSIS ?- Improve gait ?- Decrease energy expenditure ?- Improve Balance ?- Provide Triplanar stability of foot complex ?- Facilitate motion ? ?ACTIONS PERFORMED ?Patient was fit with foot orthotics trimmed to shoe last. Patient tolerated fittign procedure.  ? ?Patient was provided with verbal and written instruction and demonstration regarding donning, doffing, wear, care, proper fit, function, purpose, cleaning, and use of the orthosis and in all related precautions and risks and benefits regarding the orthosis. ? ?Patient was also provided with verbal instruction regarding how to report any failures or malfunctions of the orthosis and necessary follow up care. Patient was also instructed to contact our office regarding any change in status that may affect the function of the orthosis. ? ?Patient demonstrated independence with proper donning, doffing, and fit and verbalized understanding of all instructions. ? ?PLAN: ?Patient is to follow up in one week or as necessary (PRN). All questions were answered and concerns addressed. Plan of care was discussed with and agreed upon by the patient. ? ?

## 2021-06-07 ENCOUNTER — Ambulatory Visit: Payer: BC Managed Care – PPO | Admitting: Behavioral Health

## 2021-06-07 DIAGNOSIS — F4310 Post-traumatic stress disorder, unspecified: Secondary | ICD-10-CM

## 2021-06-07 DIAGNOSIS — F419 Anxiety disorder, unspecified: Secondary | ICD-10-CM

## 2021-06-07 DIAGNOSIS — F331 Major depressive disorder, recurrent, moderate: Secondary | ICD-10-CM
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
## 2021-07-05 ENCOUNTER — Institutional Professional Consult (permissible substitution): Payer: BC Managed Care – PPO | Admitting: Behavioral Health

## 2021-07-08 ENCOUNTER — Encounter: Payer: Self-pay | Admitting: *Deleted

## 2022-05-08 ENCOUNTER — Encounter (HOSPITAL_BASED_OUTPATIENT_CLINIC_OR_DEPARTMENT_OTHER): Payer: Self-pay | Admitting: Urology

## 2022-05-08 ENCOUNTER — Emergency Department (HOSPITAL_BASED_OUTPATIENT_CLINIC_OR_DEPARTMENT_OTHER)
Admission: EM | Admit: 2022-05-08 | Discharge: 2022-05-08 | Disposition: A | Discharge status: Home / Self Care | Payer: 59 | Attending: Emergency Medicine | Admitting: Emergency Medicine

## 2022-05-08 ENCOUNTER — Other Ambulatory Visit: Payer: Self-pay

## 2022-05-08 DIAGNOSIS — R509 Fever, unspecified: Secondary | ICD-10-CM | POA: Diagnosis present

## 2022-05-08 DIAGNOSIS — J020 Streptococcal pharyngitis: Secondary | ICD-10-CM | POA: Diagnosis not present

## 2022-05-08 LAB — GROUP A STREP BY PCR: Group A Strep by PCR: DETECTED — AB

## 2022-05-08 MED ORDER — LIDOCAINE VISCOUS HCL 2 % MT SOLN: 15.0000 mL | Freq: Four times a day (QID) | OROMUCOSAL | 0 refills | Status: DC | PRN

## 2022-05-08 MED ORDER — AMOXICILLIN 500 MG PO CAPS
1000.0000 mg | ORAL_CAPSULE | Freq: Every day | ORAL | 0 refills | Status: AC
Start: 2022-05-08 — End: 2022-05-18

## 2022-05-08 MED ORDER — LIDOCAINE VISCOUS HCL 2 % MT SOLN
15.0000 mL | Freq: Once | OROMUCOSAL | Status: AC
  Administered 2022-05-08: 15 mL via OROMUCOSAL
  Filled 2022-05-08: qty 15

## 2022-05-08 MED ORDER — AMOXICILLIN 500 MG PO CAPS: 1000.0000 mg | ORAL_CAPSULE | Freq: Every day | ORAL | 0 refills | Status: DC

## 2022-05-08 MED ORDER — ACETAMINOPHEN 500 MG PO TABS
1000.0000 mg | ORAL_TABLET | Freq: Once | ORAL | Status: AC
  Administered 2022-05-08: 1000 mg via ORAL
  Filled 2022-05-08: qty 2

## 2022-05-08 MED ORDER — DEXAMETHASONE 4 MG PO TABS
10.0000 mg | ORAL_TABLET | Freq: Once | ORAL | Status: AC
  Administered 2022-05-08: 10 mg via ORAL
  Filled 2022-05-08: qty 3

## 2022-05-08 NOTE — ED Triage Notes (Signed)
Pt states sore throat, chills, sweats, fever since Saturday  States white patches in back of throat  Pain worse with swallowing  Took ibuprofen 1 hr PTA

## 2022-05-08 NOTE — ED Notes (Signed)
Tonsils red and inflamed during strep swab

## 2022-05-08 NOTE — ED Provider Notes (Signed)
  [facility] Provider Note   CSN: [id number] Arrival date & time: 05/08/22  1830     History {Add pertinent medical, surgical, social history, OB history to HPI:1} Chief Complaint  Patient presents with   Sore Throat    Henry Ewing is a 42 y.o. male.  HPI     Yesterday sore throat, cough, fever  Home Medications Prior to Admission medications   Medication Sig Start Date End Date Taking? Authorizing Provider  acetaminophen (TYLENOL) 500 MG tablet Take by mouth.    [provider]  azithromycin (ZITHROMAX) 250 MG tablet  10/03/20   [provider]  benzonatate (TESSALON) 100 MG capsule  10/03/20   [provider]  fexofenadine (ALLEGRA) 180 MG tablet Take by mouth.    [provider]  ketoconazole (NIZORAL) 2 % cream Apply 1 application topically daily. 07/04/20   Edwin Cap, DPM  naproxen (NAPROSYN) 250 MG tablet Take by mouth. 05/26/15   [provider]  traMADol (ULTRAM) 50 MG tablet Take by mouth.    [provider]      Allergies    Patient has no known allergies.    Review of Systems   Review of Systems  Physical Exam Updated Vital Signs BP (!) 142/82 (BP Location: Left Arm)   Pulse 91   Temp (!) 101.8 F (38.8 C)   Resp 18   Ht  (1.905 m)   Wt 115.7 kg   SpO2 97%   BMI 31.87 kg/m  Physical Exam  ED Results / Procedures / Treatments   Labs (all labs ordered are listed, but only abnormal results are displayed) Labs Reviewed  GROUP A STREP BY PCR - Abnormal; Notable for the following components:      Result Value   Group A Strep by PCR DETECTED (*)    All other components within normal limits    EKG None  Radiology No results found.  Procedures Procedures  {Document cardiac monitor, telemetry assessment procedure when appropriate:1}  Medications Ordered in ED Medications  acetaminophen (TYLENOL) tablet 1,000 mg (1,000 mg  Oral Given 05/08/22 1908)  dexamethasone (DECADRON) tablet 10 mg (10 mg Oral Given 05/08/22 1909)  lidocaine (XYLOCAINE) 2 % viscous mouth solution 15 mL (15 mLs Mouth/Throat Given 05/08/22 1910)    ED Course/ Medical Decision Making/ A&P   {   Click here for ABCD2, HEART and other calculatorsREFRESH Note before signing :1}                          Medical Decision Making Risk OTC drugs. Prescription drug management.   ***  {Document critical care time when appropriate:1} {Document review of labs and clinical decision tools ie heart score, Chads2Vasc2 etc:1}  {Document your independent review of radiology images, and any outside records:1} {Document your discussion with family members, caretakers, and with consultants:1} {Document social determinants of health affecting pt's care:1} {Document your decision making why or why not admission, treatments were needed:1} Final Clinical Impression(s) / ED Diagnoses Final diagnoses:  None    Rx / DC Orders ED Discharge Orders     None

## 2022-06-21 ENCOUNTER — Other Ambulatory Visit: Payer: Self-pay

## 2022-06-21 ENCOUNTER — Emergency Department (HOSPITAL_BASED_OUTPATIENT_CLINIC_OR_DEPARTMENT_OTHER)
Admission: EM | Admit: 2022-06-21 | Discharge: 2022-06-21 | Disposition: A | Discharge status: Home / Self Care | Payer: 59 | Attending: Emergency Medicine | Admitting: Emergency Medicine

## 2022-06-21 ENCOUNTER — Emergency Department (HOSPITAL_BASED_OUTPATIENT_CLINIC_OR_DEPARTMENT_OTHER): Payer: 59

## 2022-06-21 ENCOUNTER — Encounter (HOSPITAL_BASED_OUTPATIENT_CLINIC_OR_DEPARTMENT_OTHER): Payer: Self-pay | Admitting: Emergency Medicine

## 2022-06-21 DIAGNOSIS — R112 Nausea with vomiting, unspecified: Secondary | ICD-10-CM | POA: Diagnosis not present

## 2022-06-21 DIAGNOSIS — M791 Myalgia, unspecified site: Secondary | ICD-10-CM | POA: Diagnosis not present

## 2022-06-21 DIAGNOSIS — R61 Generalized hyperhidrosis: Secondary | ICD-10-CM | POA: Insufficient documentation

## 2022-06-21 DIAGNOSIS — R0789 Other chest pain: Secondary | ICD-10-CM | POA: Diagnosis not present

## 2022-06-21 DIAGNOSIS — R1013 Epigastric pain: Secondary | ICD-10-CM | POA: Insufficient documentation

## 2022-06-21 DIAGNOSIS — R0602 Shortness of breath: Secondary | ICD-10-CM | POA: Diagnosis not present

## 2022-06-21 DIAGNOSIS — Z20822 Contact with and (suspected) exposure to covid-19: Secondary | ICD-10-CM | POA: Insufficient documentation

## 2022-06-21 DIAGNOSIS — R5383 Other fatigue: Secondary | ICD-10-CM | POA: Insufficient documentation

## 2022-06-21 LAB — COMPREHENSIVE METABOLIC PANEL
ALT: 34 U/L (ref 0–44)
AST: 30 U/L (ref 15–41)
Albumin: 4.6 g/dL (ref 3.5–5.0)
Alkaline Phosphatase: 93 U/L (ref 38–126)
Anion gap: 12 (ref 5–15)
BUN: 16 mg/dL (ref 6–20)
CO2: 24 mmol/L (ref 22–32)
Calcium: 9.3 mg/dL (ref 8.9–10.3)
Chloride: 101 mmol/L (ref 98–111)
Creatinine, Ser: 1.1 mg/dL (ref 0.61–1.24)
GFR, Estimated: >60 mL/min (ref >60)
Glucose, Bld: 121 mg/dL — ABNORMAL HIGH (ref 70–99)
Potassium: 3.6 mmol/L (ref 3.5–5.1)
Sodium: 137 mmol/L (ref 135–145)
Total Bilirubin: 0.7 mg/dL (ref 0.3–1.2)
Total Protein: 8.3 g/dL — ABNORMAL HIGH (ref 6.5–8.1)

## 2022-06-21 LAB — CBC
HCT: 43.7 % (ref 39.0–52.0)
Hemoglobin: 14.4 g/dL (ref 13.0–17.0)
MCH: 30.6 pg (ref 26.0–34.0)
MCHC: 33 g/dL (ref 30.0–36.0)
MCV: 92.8 fL (ref 80.0–100.0)
Platelets: 259 10*3/uL (ref 150–400)
RBC: 4.71 MIL/uL (ref 4.22–5.81)
RDW: 13.6 % (ref 11.5–15.5)
WBC: 8.2 10*3/uL (ref 4.0–10.5)
nRBC: 0 % (ref 0.0–0.2)

## 2022-06-21 LAB — TROPONIN I (HIGH SENSITIVITY)
Troponin I (High Sensitivity): 2 ng/L (ref <18)
Troponin I (High Sensitivity): 2 ng/L (ref <18)

## 2022-06-21 LAB — SARS CORONAVIRUS 2 BY RT PCR: SARS Coronavirus 2 by RT PCR: NEGATIVE

## 2022-06-21 LAB — LIPASE, BLOOD: Lipase: 46 U/L (ref 11–51)

## 2022-06-21 MED ORDER — SODIUM CHLORIDE 0.9 % IV SOLN: INTRAVENOUS | Status: DC | PRN

## 2022-06-21 MED ORDER — ONDANSETRON 4 MG PO TBDP: 4.0000 mg | ORAL_TABLET | Freq: Three times a day (TID) | ORAL | 0 refills | Status: DC | PRN

## 2022-06-21 MED ORDER — FAMOTIDINE 20 MG PO TABS: 20.0000 mg | ORAL_TABLET | Freq: Two times a day (BID) | ORAL | 0 refills | Status: DC

## 2022-06-21 MED ORDER — HALOPERIDOL LACTATE 5 MG/ML IJ SOLN
5.0000 mg | Freq: Once | INTRAMUSCULAR | Status: AC
  Administered 2022-06-21: 5 mg via INTRAMUSCULAR
  Filled 2022-06-21: qty 1

## 2022-06-21 MED ORDER — FAMOTIDINE IN NACL 20-0.9 MG/50ML-% IV SOLN
20.0000 mg | Freq: Once | INTRAVENOUS | Status: AC
Start: 2022-06-21 — End: 2022-06-21
  Administered 2022-06-21: 20 mg via INTRAVENOUS
  Filled 2022-06-21: qty 50

## 2022-06-21 MED ORDER — HYDROMORPHONE HCL 1 MG/ML IJ SOLN
1.0000 mg | Freq: Once | INTRAMUSCULAR | Status: AC
  Administered 2022-06-21: 1 mg via INTRAVENOUS
  Filled 2022-06-21: qty 1

## 2022-06-21 MED ORDER — KETOROLAC TROMETHAMINE 30 MG/ML IJ SOLN
30.0000 mg | Freq: Once | INTRAMUSCULAR | Status: AC
  Administered 2022-06-21: 30 mg via INTRAVENOUS
  Filled 2022-06-21: qty 1

## 2022-06-21 MED ORDER — IOHEXOL 300 MG/ML  SOLN
125.0000 mL | Freq: Once | INTRAMUSCULAR | Status: AC | PRN
  Administered 2022-06-21: 125 mL via INTRAVENOUS

## 2022-06-21 MED ORDER — SODIUM CHLORIDE 0.9 % IV BOLUS
1000.0000 mL | Freq: Once | INTRAVENOUS | Status: AC
  Administered 2022-06-21: 1000 mL via INTRAVENOUS

## 2022-06-21 NOTE — ED Notes (Signed)
Pt given Haldol per MD order. Pt heart rate noted to be decreased to 47 BMP, Oxygen level decreased down to 82%. Respiratory at bedside. Respiratory began pt on oxygen via nasal cannula at 2 L. Pt O2 level continued to remain less than 90%. O2 increased to 4 L via Nasal cannula.

## 2022-06-21 NOTE — ED Notes (Signed)
Pt. Has calmed down after getting meds and is reporting to  RN Earlene Plater that he ate some bad food last night.  Pt. Reports he has had diarrhea and vomiting since early this morning.

## 2022-06-21 NOTE — ED Provider Notes (Signed)
[place] [facility] Provider Note   CSN: [id number] Arrival date & time: 06/21/22  1402     History  Chief Complaint  Patient presents with   Chest Pain   Abdominal Pain    Henry Ewing is a 42 y.o. male presented to ED with complaint of acute epigastric abdominal pain.  Patient reports that he came home from work feeling fine this morning and took a nap and when he woke up he began having severe pain in his epigastrium, which she has never felt before, as well as nausea, muscle aches, fatigue.  He says he does smoke cigarettes as well as marijuana.  He has a history of IBS and gastritis and heartburn issues.  He reports he has some chest discomfort as well and feels short of breath.  HPI     Home Medications Prior to Admission medications   Medication Sig Start Date End Date Taking? Authorizing Provider  acetaminophen (TYLENOL) 500 MG tablet Take by mouth.    [provider]  azithromycin (ZITHROMAX) 250 MG tablet  10/03/20   [provider]  benzonatate (TESSALON) 100 MG capsule  10/03/20   [provider]  famotidine (PEPCID) 20 MG tablet Take 1 tablet (20 mg total) by mouth 2 (two) times daily. 06/21/22 07/21/22  Terald Sleeper, MD  fexofenadine (ALLEGRA) 180 MG tablet Take by mouth.    [provider]  ketoconazole (NIZORAL) 2 % cream Apply 1 application topically daily. 07/04/20   McDonald, Rachelle Hora, DPM  lidocaine (XYLOCAINE) 2 % solution Use as directed 15 mLs in the mouth or throat every 6 (six) hours as needed for mouth pain (throat pain). 05/08/22   Alvira Monday, MD  naproxen (NAPROSYN) 250 MG tablet Take by mouth. 05/26/15   [provider]  ondansetron (ZOFRAN-ODT) 4 MG disintegrating tablet Take 1 tablet (4 mg total) by mouth every 8 (eight) hours as needed for up to 12 doses for nausea or vomiting. 06/21/22   Terald Sleeper, MD  traMADol (ULTRAM) 50 MG tablet Take by mouth.     [provider]      Allergies    Patient has no known allergies.    Review of Systems   Review of Systems  Physical Exam Updated Vital Signs BP 118/60   Pulse (!) 50   Temp 98.2 F (36.8 C) (Oral)   Resp 15   Ht 6\' 3"  (1.905 m)   Wt 115.7 kg   SpO2 100%   BMI 31.87 kg/m  Physical Exam Constitutional:      General: He is not in acute distress.    Appearance: He is diaphoretic.  HENT:     Head: Normocephalic and atraumatic.  Eyes:     Conjunctiva/sclera: Conjunctivae normal.     Pupils: Pupils are equal, round, and reactive to light.  Cardiovascular:     Rate and Rhythm: Normal rate and regular rhythm.  Pulmonary:     Effort: Pulmonary effort is normal. No respiratory distress.  Abdominal:     General: There is no distension.     Tenderness: There is abdominal tenderness in the epigastric area.  Skin:    General: Skin is warm.  Neurological:     General: No focal deficit present.     Mental Status: He is alert. Mental status is at baseline.  Psychiatric:        Mood and Affect: Mood normal.        Behavior:  Behavior normal.     ED Results / Procedures / Treatments   Labs (all labs ordered are listed, but only abnormal results are displayed) Labs Reviewed  COMPREHENSIVE METABOLIC PANEL - Abnormal; Notable for the following components:      Result Value   Glucose, Bld 121 (*)    Total Protein 8.3 (*)    All other components within normal limits  SARS CORONAVIRUS 2 BY RT PCR  LIPASE, BLOOD  CBC  TROPONIN I (HIGH SENSITIVITY)  TROPONIN I (HIGH SENSITIVITY)    EKG EKG Interpretation  Date/Time:  Thursday June 21 2022 14:11:47 EDT Ventricular Rate:  61 PR Interval:  174 QRS Duration: 105 QT Interval:  380 QTC Calculation: 383 R Axis:   82 Text Interpretation: Sinus rhythm Consider right ventricular hypertrophy  NO sig change from prior tracing Jan 23 2021 Confirmed by Alvester Chou [phone]) on 06/21/2022 4:12:04 PM  Radiology CT ABDOMEN  PELVIS W CONTRAST  Result Date: 06/21/2022 CLINICAL DATA:  Acute abdominal pain. EXAM: CT ABDOMEN AND PELVIS WITH CONTRAST TECHNIQUE: Multidetector CT imaging of the abdomen and pelvis was performed using the standard protocol following bolus administration of intravenous contrast. RADIATION DOSE REDUCTION: This exam was performed according to the departmental dose-optimization program which includes automated exposure control, adjustment of the mA and/or kV according to patient size and/or use of iterative reconstruction technique. CONTRAST:  OMNIPAQUE IOHEXOL 300 MG/ML  SOLN COMPARISON:  CT abdomen pelvis 08/06/2018 FINDINGS: Lower chest: No acute abnormality. Hepatobiliary: No focal liver abnormality is seen. No gallstones, gallbladder wall thickening, or biliary dilatation. Pancreas: Unremarkable. No pancreatic ductal dilatation or surrounding inflammatory changes. Spleen: Normal in size without focal abnormality. Adrenals/Urinary Tract: Adrenal glands are unremarkable. Kidneys are normal, without renal calculi, focal lesion, or hydronephrosis. Bladder is unremarkable. Stomach/Bowel: Stomach is within normal limits. Appendix appears normal. No evidence of bowel wall thickening, distention, or inflammatory changes. Vascular/Lymphatic: No significant vascular findings are present. No enlarged abdominal or pelvic lymph nodes. Reproductive: Status post hysterectomy. No adnexal masses. Other: No abdominal wall hernia or abnormality. No abdominopelvic ascites. Musculoskeletal: No acute or significant osseous findings. IMPRESSION: No acute intra-abdominal pathology. Electronically Signed   By: Emmaline Kluver M.D.   On: 06/21/2022 18:41   DG Chest 2 View  Result Date: 06/21/2022 CLINICAL DATA:  Chest pain, abdominal pain, nausea, and vomiting EXAM: CHEST - 2 VIEW COMPARISON:  Chest radiograph dated 01/23/2021 FINDINGS: Normal lung volumes. No focal consolidations. No pleural effusion or pneumothorax. The  heart size and mediastinal contours are within normal limits. No acute osseous abnormality. IMPRESSION: No active cardiopulmonary disease. Electronically Signed   By: Agustin Cree M.D.   On: 06/21/2022 15:06    Procedures Procedures    Medications Ordered in ED Medications  HYDROmorphone (DILAUDID) injection 1 mg (1 mg Intravenous Given 06/21/22 1726)  ketorolac (TORADOL) 30 MG/ML injection 30 mg (30 mg Intravenous Given 06/21/22 1727)  sodium chloride 0.9 % bolus 1,000 mL (0 mLs Intravenous Stopped 06/21/22 1935)  famotidine (PEPCID) IVPB 20 mg premix (0 mg Intravenous Stopped 06/21/22 2057)  haloperidol lactate (HALDOL) injection 5 mg (5 mg Intramuscular Given 06/21/22 1713)  iohexol (OMNIPAQUE) 300 MG/ML solution 125 mL (125 mLs Intravenous Contrast Given 06/21/22 1743)    ED Course/ Medical Decision Making/ A&P Clinical Course as of 06/22/22 1031  Thu Jun 21, 2022  2041 Patient is feeling significantly better after medications, I suspect this is likely foodborne illness.  He is no longer diaphoretic, he is stable on  room air.  He is wanting to go home and tolerated p.o. easily here.  Will discharge with Pepcid and Zofran [MT]    Clinical Course User Index [MT] Maleny Candy, Kermit Balo, MD                             Medical Decision Making Amount and/or Complexity of Data Reviewed Labs: ordered. Radiology: ordered.  Risk Prescription drug management.   This patient presents to the ED with concern for epigastric discomfort, nausea, diaphoresis. This involves an extensive number of treatment options, and is a complaint that carries with it a high risk of complications and morbidity.  The differential diagnosis includes pancreatitis versus gastritis versus acute biliary disease versus atypical ACS versus viral illness versus gastroparesis or cyclical vomiting versus other.  Co-morbidities that complicate the patient evaluation: Cardiovascular risk factor, smoking; alcohol consumption risk factor for  gastritis and pancreatitis  External records from outside source obtained and reviewed including CT abdomen pelvis 2020 with no vascular abnormalities noted.  No history of AAA.  I ordered and personally interpreted labs.  The pertinent results include: No leukocytosis.  Labs are largely unremarkable.  Initial troponin negative.  Creatinine within normal limits.  Lipase within normal limits.  LFTs within normal limits  I ordered imaging studies including CT abdomen pelvis with contrast, dg chest I independently visualized and interpreted imaging which showed no emergent findings I agree with the radiologist interpretation  The patient was maintained on a cardiac monitor.  I personally viewed and interpreted the cardiac monitored which showed an underlying rhythm of: Sinus rhythm  Per my interpretation the patient's ECG shows no acute ischemic findings  I ordered medication including IV Dilaudid, Pepcid, Haldol, fluids, for epigastric pain, potential gastritis  I have reviewed the patients home medicines and have made adjustments as needed  Test Considered: doubt testicular torsion, AAA - no indication for CTA or doppler GU ultrasound imaging  After the interventions noted above, I reevaluated the patient and found that they have: improved  Patient reporting he felt "great" after medications, ambulating in ED and drinking fluids, no longer diaphoretic. Vitals wnl.  Dispostion:  After consideration of the diagnostic results and the patients response to treatment, I feel that the patent would benefit from close outpatient follow up.  I suspect food-borne vs viral illness most likely.  I do not see evidence of surgical or bacterial infectious emergency, nor ACS/AAA at this time.  Okay for discharge.         Final Clinical Impression(s) / ED Diagnoses Final diagnoses:  Nausea and vomiting, unspecified vomiting type    Rx / DC Orders ED Discharge Orders          Ordered     famotidine (PEPCID) 20 MG tablet  2 times daily,   Status:  Discontinued        06/21/22 2042    ondansetron (ZOFRAN-ODT) 4 MG disintegrating tablet  Every 8 hours PRN,   Status:  Discontinued        06/21/22 2042    famotidine (PEPCID) 20 MG tablet  2 times daily        06/21/22 2055    ondansetron (ZOFRAN-ODT) 4 MG disintegrating tablet  Every 8 hours PRN        06/21/22 2055              Terald Sleeper, MD 06/22/22 1032

## 2022-06-21 NOTE — ED Notes (Signed)
Patient transported to CT 

## 2022-06-21 NOTE — ED Notes (Signed)
Pt extremely restless at this time. Pt is stating that he is freezing and breaking out in a cold sweat. Pt is restless.

## 2022-06-21 NOTE — ED Triage Notes (Signed)
Per [facility] pt c/o left sided chest discomfort and abdominal pain with nausea and emesis. Patient reports eating fried chicken last night and that he has IBS. States smoking weed has helped with his pain. 4mg  zofran given en route.

## 2022-06-21 NOTE — ED Notes (Signed)
Placed on monitor, BP, SpO2

## 2022-06-21 NOTE — ED Notes (Signed)
In to see Pt.   Asked Pt. To put on a gown Pt. Uncooperative with following tasks and with following orders of RN so that I could fully assess the Pt.    Pt. Sweating and reports is stomach hurts.  Will attempt to reassess the Pt. After he changes into a gown.

## 2022-06-21 NOTE — ED Notes (Signed)
Pt. Reports he feels 100% better and is now drinking some ginger ale.

## 2023-01-18 ENCOUNTER — Emergency Department (HOSPITAL_BASED_OUTPATIENT_CLINIC_OR_DEPARTMENT_OTHER)
Admission: EM | Admit: 2023-01-18 | Discharge: 2023-01-18 | Disposition: A | Discharge status: Home / Self Care | Payer: No Typology Code available for payment source | Attending: Emergency Medicine | Admitting: Emergency Medicine

## 2023-01-18 ENCOUNTER — Other Ambulatory Visit: Payer: Self-pay

## 2023-01-18 ENCOUNTER — Encounter (HOSPITAL_BASED_OUTPATIENT_CLINIC_OR_DEPARTMENT_OTHER): Payer: Self-pay

## 2023-01-18 DIAGNOSIS — Z202 Contact with and (suspected) exposure to infections with a predominantly sexual mode of transmission: Secondary | ICD-10-CM | POA: Insufficient documentation

## 2023-01-18 DIAGNOSIS — N485 Ulcer of penis: Secondary | ICD-10-CM | POA: Insufficient documentation

## 2023-01-18 DIAGNOSIS — R739 Hyperglycemia, unspecified: Secondary | ICD-10-CM | POA: Insufficient documentation

## 2023-01-18 DIAGNOSIS — R590 Localized enlarged lymph nodes: Secondary | ICD-10-CM | POA: Diagnosis not present

## 2023-01-18 DIAGNOSIS — R55 Syncope and collapse: Secondary | ICD-10-CM | POA: Insufficient documentation

## 2023-01-18 LAB — CBC WITH DIFFERENTIAL/PLATELET
Abs Immature Granulocytes: 0.01 10*3/uL (ref 0.00–0.07)
Basophils Absolute: 0 10*3/uL (ref 0.0–0.1)
Basophils Relative: 1 %
Eosinophils Absolute: 0.1 10*3/uL (ref 0.0–0.5)
Eosinophils Relative: 2 %
HCT: 44.7 % (ref 39.0–52.0)
Hemoglobin: 14.6 g/dL (ref 13.0–17.0)
Immature Granulocytes: 0 %
Lymphocytes Relative: 39 %
Lymphs Abs: 2.3 10*3/uL (ref 0.7–4.0)
MCH: 30.9 pg (ref 26.0–34.0)
MCHC: 32.7 g/dL (ref 30.0–36.0)
MCV: 94.7 fL (ref 80.0–100.0)
Monocytes Absolute: 0.6 10*3/uL (ref 0.1–1.0)
Monocytes Relative: 11 %
Neutro Abs: 2.7 10*3/uL (ref 1.7–7.7)
Neutrophils Relative %: 47 %
Platelets: 259 10*3/uL (ref 150–400)
RBC: 4.72 MIL/uL (ref 4.22–5.81)
RDW: 13.6 % (ref 11.5–15.5)
WBC: 5.8 10*3/uL (ref 4.0–10.5)
nRBC: 0 % (ref 0.0–0.2)

## 2023-01-18 LAB — COMPREHENSIVE METABOLIC PANEL
ALT: 22 U/L (ref 0–44)
AST: 20 U/L (ref 15–41)
Albumin: 4.2 g/dL (ref 3.5–5.0)
Alkaline Phosphatase: 92 U/L (ref 38–126)
Anion gap: 6 (ref 5–15)
BUN: 9 mg/dL (ref 6–20)
CO2: 28 mmol/L (ref 22–32)
Calcium: 9.1 mg/dL (ref 8.9–10.3)
Chloride: 103 mmol/L (ref 98–111)
Creatinine, Ser: 0.9 mg/dL (ref 0.61–1.24)
GFR, Estimated: >60 mL/min (ref >60)
Glucose, Bld: 111 mg/dL — ABNORMAL HIGH (ref 70–99)
Potassium: 3.7 mmol/L (ref 3.5–5.1)
Sodium: 137 mmol/L (ref 135–145)
Total Bilirubin: 0.5 mg/dL (ref 0.0–1.2)
Total Protein: 7.5 g/dL (ref 6.5–8.1)

## 2023-01-18 LAB — URINALYSIS, ROUTINE W REFLEX MICROSCOPIC
Bilirubin Urine: NEGATIVE
Glucose, UA: NEGATIVE mg/dL
Hgb urine dipstick: NEGATIVE
Ketones, ur: NEGATIVE mg/dL
Leukocytes,Ua: NEGATIVE
Nitrite: NEGATIVE
Protein, ur: NEGATIVE mg/dL
Specific Gravity, Urine: 1.025 (ref 1.005–1.030)
pH: 6.5 (ref 5.0–8.0)

## 2023-01-18 LAB — HIV ANTIBODY (ROUTINE TESTING W REFLEX): HIV Screen 4th Generation wRfx: NONREACTIVE

## 2023-01-18 LAB — CBG MONITORING, ED: Glucose-Capillary: 158 mg/dL — ABNORMAL HIGH (ref 70–99)

## 2023-01-18 MED ORDER — PENICILLIN G BENZATHINE 1200000 UNIT/2ML IM SUSY
2.4000 10*6.[IU] | PREFILLED_SYRINGE | Freq: Once | INTRAMUSCULAR | Status: AC
  Administered 2023-01-18: 2.4 10*6.[IU] via INTRAMUSCULAR
  Filled 2023-01-18: qty 4

## 2023-01-18 MED ORDER — CEFTRIAXONE SODIUM 500 MG IJ SOLR
500.0000 mg | Freq: Once | INTRAMUSCULAR | Status: AC
  Administered 2023-01-18: 500 mg via INTRAMUSCULAR
  Filled 2023-01-18: qty 500

## 2023-01-18 MED ORDER — DOXYCYCLINE HYCLATE 100 MG PO CAPS: 100.0000 mg | ORAL_CAPSULE | Freq: Two times a day (BID) | ORAL | 0 refills | Status: AC

## 2023-01-18 MED ORDER — DOXYCYCLINE HYCLATE 100 MG PO CAPS: 100.0000 mg | ORAL_CAPSULE | Freq: Two times a day (BID) | ORAL | 0 refills | Status: DC

## 2023-01-18 MED ORDER — LIDOCAINE HCL (PF) 1 % IJ SOLN
1.0000 mL | Freq: Once | INTRAMUSCULAR | Status: AC
  Administered 2023-01-18: 1 mL
  Filled 2023-01-18: qty 5

## 2023-01-18 NOTE — ED Triage Notes (Signed)
 The patient noticed a lump in his groin with pain. He stated he was checking it out and had a near syncopal episode. He stated he noticed a outbreak on his penis.
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
## 2023-01-18 NOTE — ED Notes (Addendum)
 Error entry

## 2023-01-18 NOTE — Discharge Instructions (Signed)
 You were seen in the emergency department today with concerns of groin swelling and near syncope.  Your labs were reassuring and your EKG was unremarkable.  I am concerned for possible syphilis given the area of slight ulceration on your penis.  For this reason, you are treated with a dose of penicillin  G benzathine as well as Rocephin  for possible gonorrhea chlamydia.  You should continue to take the doxycycline  prescription which I sent to your pharmacy for the next 7 days.  If you have any new or worsening symptoms, please return the emergency department.  Please avoid any sexual activity for at least 1 week after finishing medications.  Please encourage her partners also be tested and treated.

## 2023-01-19 LAB — RPR: RPR Ser Ql: NONREACTIVE

## 2023-01-21 LAB — GC/CHLAMYDIA PROBE AMP (~~LOC~~) NOT AT ARMC
Chlamydia: NEGATIVE
Comment: NEGATIVE
Comment: NORMAL
Neisseria Gonorrhea: NEGATIVE

## 2023-02-26 ENCOUNTER — Ambulatory Visit: Payer: No Typology Code available for payment source | Admitting: Family Medicine

## 2023-02-27 ENCOUNTER — Ambulatory Visit: Payer: No Typology Code available for payment source | Admitting: Family Medicine

## 2023-03-04 ENCOUNTER — Encounter: Payer: Self-pay | Admitting: Family Medicine

## 2023-03-04 ENCOUNTER — Ambulatory Visit (INDEPENDENT_AMBULATORY_CARE_PROVIDER_SITE_OTHER): Payer: Commercial Managed Care - PPO | Admitting: Family Medicine

## 2023-03-04 VITALS — BP 100/59 | HR 83 | Temp 98.2°F | Ht 75.0 in | Wt 252.5 lb

## 2023-03-04 DIAGNOSIS — Z7689 Persons encountering health services in other specified circumstances: Secondary | ICD-10-CM

## 2023-03-04 DIAGNOSIS — R600 Localized edema: Secondary | ICD-10-CM | POA: Diagnosis not present

## 2023-03-04 DIAGNOSIS — K529 Noninfective gastroenteritis and colitis, unspecified: Secondary | ICD-10-CM

## 2023-03-04 DIAGNOSIS — E876 Hypokalemia: Secondary | ICD-10-CM
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
## 2023-03-04 NOTE — Assessment & Plan Note (Signed)
 Symptoms have improved and no longer vomiting.  CT scan abdomen negative for acute findings.  Feeling better now. No further evaluation needed.

## 2023-03-04 NOTE — Assessment & Plan Note (Signed)
 2/7 ED visit labs showed potassium level 3.1. Will recheck today.  This has likely improved since the vomiting has resolved.

## 2023-03-05 ENCOUNTER — Encounter: Payer: Self-pay | Admitting: Family Medicine

## 2023-03-05 LAB — BASIC METABOLIC PANEL
BUN/Creatinine Ratio: 8 — ABNORMAL LOW (ref 9–20)
BUN: 8 mg/dL (ref 6–24)
CO2: 27 mmol/L (ref 20–29)
Calcium: 8.9 mg/dL (ref 8.7–10.2)
Chloride: 103 mmol/L (ref 96–106)
Creatinine, Ser: 1.02 mg/dL (ref 0.76–1.27)
Glucose: 109 mg/dL — ABNORMAL HIGH (ref 70–99)
Potassium: 4 mmol/L (ref 3.5–5.2)
Sodium: 144 mmol/L (ref 134–144)
eGFR: 94 mL/min/{1.73_m2} (ref >59)

## 2023-03-06 ENCOUNTER — Ambulatory Visit: Payer: No Typology Code available for payment source | Admitting: Podiatry

## 2023-03-13 ENCOUNTER — Ambulatory Visit (INDEPENDENT_AMBULATORY_CARE_PROVIDER_SITE_OTHER): Payer: No Typology Code available for payment source | Admitting: Podiatry

## 2023-03-13 ENCOUNTER — Ambulatory Visit (INDEPENDENT_AMBULATORY_CARE_PROVIDER_SITE_OTHER): Payer: Commercial Managed Care - PPO

## 2023-03-13 ENCOUNTER — Encounter: Payer: Self-pay | Admitting: Podiatry

## 2023-03-13 DIAGNOSIS — L84 Corns and callosities: Secondary | ICD-10-CM

## 2023-03-13 DIAGNOSIS — B351 Tinea unguium: Secondary | ICD-10-CM | POA: Diagnosis not present

## 2023-03-13 DIAGNOSIS — M7751 Other enthesopathy of right foot: Secondary | ICD-10-CM | POA: Diagnosis not present

## 2023-03-13 MED ORDER — TERBINAFINE HCL 250 MG PO TABS: 250.0000 mg | ORAL_TABLET | Freq: Every day | ORAL | 0 refills | Status: DC

## 2023-03-13 NOTE — Progress Notes (Signed)
 Subjective:   Patient ID: Henry Ewing, male   DOB: 43 y.o.   MRN: [id number]   HPI Patient states this lesion on the left is getting worse and making it harder for me to wear shoe gear comfortably and getting problems between the third and fourth toes right I have thick toenail disease and I have skin that she has between all my toes   ROS      Objective:  Physical Exam  Neurovascular status intact muscle strength adequate range of motion adequate with thick yellow brittle nailbeds 1-5 both feet that are dystrophic and a severe lesions subthird metatarsal of the left digital deformity second and third toes left and digital deformity with fluid buildup third toe right     Assessment:  Chronic lesion left secondary to foot structure along with hammertoe deformity 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  I did discuss possibility for elevating osteotomy digital fusion left which I think would be in his best interest but would not hold off currently with debridement accomplished and for the right I went ahead today and I did do careful injection of the inner phalangeal joint 3 mg Dexasone Kenalog 5 g.  Discussed the nail and skin condition and we are can start on antifungal and patient states that he did just have blood work done and his liver function was normal and he is due to have it repeated fairly soon.  X-rays do indicate the lesion to be on the third metatarsal head left with elevated digits and pressure between the third and fourth toes right foot

## 2023-03-20 ENCOUNTER — Encounter: Payer: Self-pay | Admitting: Family Medicine

## 2023-03-20 ENCOUNTER — Ambulatory Visit (INDEPENDENT_AMBULATORY_CARE_PROVIDER_SITE_OTHER): Payer: Commercial Managed Care - PPO | Admitting: Family Medicine

## 2023-03-20 VITALS — BP 110/76 | HR 77 | Temp 98.4°F | Resp 18 | Ht 75.0 in | Wt 251.0 lb

## 2023-03-20 DIAGNOSIS — Z13228 Encounter for screening for other metabolic disorders: Secondary | ICD-10-CM

## 2023-03-20 DIAGNOSIS — Z13220 Encounter for screening for lipoid disorders: Secondary | ICD-10-CM | POA: Diagnosis not present

## 2023-03-20 DIAGNOSIS — Z1329 Encounter for screening for other suspected endocrine disorder: Secondary | ICD-10-CM

## 2023-03-20 DIAGNOSIS — Z136 Encounter for screening for cardiovascular disorders: Secondary | ICD-10-CM

## 2023-03-20 DIAGNOSIS — Z0000 Encounter for general adult medical examination without abnormal findings: Secondary | ICD-10-CM | POA: Diagnosis not present

## 2023-03-20 DIAGNOSIS — I999 Unspecified disorder of circulatory system: Secondary | ICD-10-CM | POA: Insufficient documentation

## 2023-03-20 NOTE — Progress Notes (Signed)
 Complete physical exam  Patient: Henry Ewing   DOB: 06/23/1980   43 y.o. Male  MRN: [id number]  Subjective:    Chief Complaint  Patient presents with   Annual Exam    Patient is here for his annual physical patient is fasting this morning    Henry Ewing is a 43 y.o. male who presents today for a complete physical exam. He reports consuming a general diet. Rides a bike at home and goes to [employer] for strength training.  He generally feels well. He reports sleeping fairly well. He does not have additional problems to discuss today.   Has dentist appointment next week.    Most recent fall risk assessment:    03/04/2023    3:58 PM  Fall Risk   Falls in the past year? 0  Number falls in past yr: 0  Injury with Fall? 0  Risk for fall due to : No Fall Risks  Follow up Falls evaluation completed     Most recent depression screenings:    03/04/2023    3:58 PM 05/22/2021    9:34 AM  PHQ 2/9 Scores  PHQ - 2 Score 0 2  PHQ- 9 Score 0 7    Vision:Within last year and Dental: No current dental problems and Receives regular dental care    Patient Care Team: Novella Olive, FNP as PCP - General (Family Medicine)   Outpatient Medications Prior to Visit  Medication Sig   ibuprofen (ADVIL) 200 MG tablet Take 400 mg by mouth every 4 (four) hours as needed.   terbinafine (LAMISIL) 250 MG tablet Take 1 tablet (250 mg total) by mouth daily.   No facility-administered medications prior to visit.    ROS        Objective:     BP 110/76   Pulse 77   Temp 98.4 F (36.9 C) (Oral)   Resp 18   Ht 6\' 3"  (1.905 m)   Wt 251 lb (113.9 kg)   SpO2 100%   BMI 31.37 kg/m    Physical Exam Vitals and nursing note reviewed.  Constitutional:      General: He is not in acute distress.    Appearance: Normal appearance.  HENT:     Right Ear: Tympanic membrane normal.     Left Ear: Tympanic membrane normal.     Nose: Nose normal.     Mouth/Throat:      Mouth: Mucous membranes are moist.     Pharynx: Oropharynx is clear.  Eyes:     Extraocular Movements: Extraocular movements intact.  Neck:     Thyroid: No thyroid tenderness.  Cardiovascular:     Rate and Rhythm: Normal rate and regular rhythm.     Pulses:          Radial pulses are 2+ on the right side and 2+ on the left side.     Heart sounds: Normal heart sounds, S1 normal and S2 normal.  Pulmonary:     Effort: Pulmonary effort is normal.     Breath sounds: Normal breath sounds.  Abdominal:     General: Bowel sounds are normal.     Palpations: Abdomen is soft.     Tenderness: There is no abdominal tenderness.  Musculoskeletal:        General: Normal range of motion.     Cervical back: Normal range of motion.     Right lower leg: No edema.     Left lower leg: No  edema.  Lymphadenopathy:     Cervical:     Right cervical: No superficial cervical adenopathy.    Left cervical: No superficial cervical adenopathy.  Skin:    General: Skin is warm and dry.  Neurological:     General: No focal deficit present.     Mental Status: He is alert. Mental status is at baseline.  Psychiatric:        Mood and Affect: Mood normal.        Behavior: Behavior normal.        Thought Content: Thought content normal.        Judgment: Judgment normal.      No results found for any visits on 03/20/23.     Assessment & Plan:    Routine Health Maintenance and Physical Exam  Immunization History  Administered Date(s) Administered   Tdap 12/29/2016    Health Maintenance  Topic Date Due   INFLUENZA VACCINE  Never done   COVID-19 Vaccine (1 - 2024-25 season) Never done   DTaP/Tdap/Td (2 - Td or Tdap) 12/30/2026   Hepatitis C Screening  Completed   HIV Screening  Completed   HPV VACCINES  Aged Out    Discussed health benefits of physical activity, and encouraged him to engage in regular exercise appropriate for his age and condition.  Annual physical exam -     CBC with  Differential/Platelet -     Comprehensive metabolic panel  Encounter for lipid screening for cardiovascular disease -     Lipid panel  Screening for thyroid disorder -     TSH + free T4  Encounter for screening for metabolic disorder -     Comprehensive metabolic panel -     Hemoglobin A1c  Circulation problem -     Ambulatory referral to Vascular Surgery      Routine labs ordered.  HCM reviewed/discussed. Anticipatory guidance regarding healthy weight, lifestyle and choices given. Recommend healthy diet.  Recommend approximately 150 minutes/week of moderate intensity exercise. Resistance training is good for building muscles and for bone health. Muscle mass helps to increase our metabolism and to burn more calories at rest.  Limit alcohol consumption: no more than one drink per day for women and 2 drinks per day for me. Recommend regular dental and vision exams. Always use seatbelt/lap and shoulder restraints. Recommend using smoke alarms and checking batteries at least twice a year. Recommend using sunscreen when outside.  Please know that I am here to help you with all of your health care goals and am happy to work with you to find a solution that works best for you.  The greatest advice I have received with any changes in life are to take it one step at a time, that even means if all you can focus on is the next 60 seconds, then do that and celebrate your victories.  With any changes in life, you will have set backs, and that is OK. The important thing to remember is, if you have a set back, it is not a failure, it is an opportunity to try again! Agrees with plan of care discussed.  Questions answered.      Return in about 1 year (around 03/19/2024) for CPE with labs.     Novella Olive, FNP

## 2023-03-21 ENCOUNTER — Encounter: Payer: Self-pay | Admitting: Family Medicine

## 2023-03-21 LAB — COMPREHENSIVE METABOLIC PANEL
ALT: 29 IU/L (ref 0–44)
AST: 23 IU/L (ref 0–40)
Albumin: 4.4 g/dL (ref 4.1–5.1)
Alkaline Phosphatase: 110 IU/L (ref 44–121)
BUN/Creatinine Ratio: 8 — ABNORMAL LOW (ref 9–20)
BUN: 7 mg/dL (ref 6–24)
Bilirubin Total: 0.5 mg/dL (ref 0.0–1.2)
CO2: 23 mmol/L (ref 20–29)
Calcium: 9.5 mg/dL (ref 8.7–10.2)
Chloride: 107 mmol/L — ABNORMAL HIGH (ref 96–106)
Creatinine, Ser: 0.92 mg/dL (ref 0.76–1.27)
Globulin, Total: 2.5 g/dL (ref 1.5–4.5)
Glucose: 98 mg/dL (ref 70–99)
Potassium: 4.1 mmol/L (ref 3.5–5.2)
Sodium: 143 mmol/L (ref 134–144)
Total Protein: 6.9 g/dL (ref 6.0–8.5)
eGFR: 106 mL/min/{1.73_m2} (ref >59)

## 2023-03-21 LAB — CBC WITH DIFFERENTIAL/PLATELET
Basophils Absolute: 0 10*3/uL (ref 0.0–0.2)
Basos: 1 %
EOS (ABSOLUTE): 0 10*3/uL (ref 0.0–0.4)
Eos: 1 %
Hematocrit: 41.5 % (ref 37.5–51.0)
Hemoglobin: 13.5 g/dL (ref 13.0–17.7)
Immature Grans (Abs): 0 10*3/uL (ref 0.0–0.1)
Immature Granulocytes: 0 %
Lymphocytes Absolute: 2.4 10*3/uL (ref 0.7–3.1)
Lymphs: 48 %
MCH: 30.5 pg (ref 26.6–33.0)
MCHC: 32.5 g/dL (ref 31.5–35.7)
MCV: 94 fL (ref 79–97)
Monocytes Absolute: 0.5 10*3/uL (ref 0.1–0.9)
Monocytes: 10 %
Neutrophils Absolute: 1.9 10*3/uL (ref 1.4–7.0)
Neutrophils: 40 %
Platelets: 310 10*3/uL (ref 150–450)
RBC: 4.43 x10E6/uL (ref 4.14–5.80)
RDW: 12.1 % (ref 11.6–15.4)
WBC: 4.9 10*3/uL (ref 3.4–10.8)

## 2023-03-21 LAB — LIPID PANEL
Chol/HDL Ratio: 4.2 ratio (ref 0.0–5.0)
Cholesterol, Total: 191 mg/dL (ref 100–199)
HDL: 45 mg/dL (ref >39)
LDL Chol Calc (NIH): 128 mg/dL — ABNORMAL HIGH (ref 0–99)
Triglycerides: 99 mg/dL (ref 0–149)
VLDL Cholesterol Cal: 18 mg/dL (ref 5–40)

## 2023-03-21 LAB — HEMOGLOBIN A1C
Est. average glucose Bld gHb Est-mCnc: 100 mg/dL
Hgb A1c MFr Bld: 5.1 % (ref 4.8–5.6)

## 2023-03-21 LAB — TSH+FREE T4
Free T4: 0.99 ng/dL (ref 0.82–1.77)
TSH: 1.21 u[IU]/mL (ref 0.450–4.500)

## 2023-04-10 ENCOUNTER — Ambulatory Visit: Payer: No Typology Code available for payment source | Admitting: Family Medicine

## 2023-04-15 ENCOUNTER — Ambulatory Visit: Payer: Self-pay
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
## 2023-04-16 ENCOUNTER — Ambulatory Visit (INDEPENDENT_AMBULATORY_CARE_PROVIDER_SITE_OTHER): Admitting: Family Medicine

## 2023-04-16 ENCOUNTER — Inpatient Hospital Stay: Admitting: Family Medicine

## 2023-04-16 ENCOUNTER — Encounter: Payer: Self-pay | Admitting: Family Medicine

## 2023-04-16 DIAGNOSIS — F4310 Post-traumatic stress disorder, unspecified: Secondary | ICD-10-CM | POA: Insufficient documentation

## 2023-04-16 DIAGNOSIS — S46812S Strain of other muscles, fascia and tendons at shoulder and upper arm level, left arm, sequela: Secondary | ICD-10-CM | POA: Insufficient documentation

## 2023-04-16 DIAGNOSIS — S4992XD Unspecified injury of left shoulder and upper arm, subsequent encounter: Secondary | ICD-10-CM

## 2023-04-16 MED ORDER — KETOROLAC TROMETHAMINE 30 MG/ML IJ SOLN
30.0000 mg | Freq: Once | INTRAMUSCULAR | Status: AC
  Administered 2023-04-16: 30 mg via INTRAMUSCULAR

## 2023-04-16 NOTE — Patient Instructions (Signed)
 Hold NSAIDs/ibuprofen for next 24 hours.  Alternate ice and heat to left side of neck. I placed a referral  today for orthopedics to see you about the shoulder.

## 2023-04-16 NOTE — Assessment & Plan Note (Signed)
 Referral placed per request.  Wants to resume therapy.

## 2023-04-16 NOTE — Assessment & Plan Note (Addendum)
 Significant tenderness to palpation upon exam today. ROM intact to neck. Recommend alternating ice and heat. Gentle massage once feeling better. Toradol 30 mg IM today. Hold all other NSAIDs for next 24 hours. Okay to use muscle relaxer as prescribed.   Left shoulder with limited ROM due to pain. Negative x-ray on 04/13/23. Referral to ortho for evaluation and treatment.  Toradol 30 mg IM given. Resume NSAID use in 24 hours.
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
## 2023-04-18 ENCOUNTER — Ambulatory Visit (HOSPITAL_BASED_OUTPATIENT_CLINIC_OR_DEPARTMENT_OTHER): Admitting: Student

## 2023-04-18 ENCOUNTER — Telehealth: Payer: Self-pay
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
## 2023-04-18 NOTE — Telephone Encounter (Signed)
 Patient dropped off paperwork for Certification of Health Care Provider to be filled out by Dorena Bodo NP.  Told the patient that there could be a charge of $29 and could take up to 3-7 business day. Placed paperwork in the work box.

## 2023-04-18 NOTE — Telephone Encounter (Signed)
 Patients Attorney's office called regarding information that they would need for his workers comp case. Their office requested a copy of the referral to be sent to them. Explained to the representative that patient would need to sign a medical record release form allowing [place] to send his information to them. Representative is aware and verbal understanding was given.

## 2023-04-18 NOTE — Telephone Encounter (Signed)
 Paperwork given to Henry Ewing for her to review. Henry Ewing stated that the paperwork looked like it was paperwork required for workmen's comp. If it is workmen's comp paperwork, primary care does not fill out this type of paperwork.   Attempted to call patient to further discuss this with him but unable to reach. Left VM for pt to return call.

## 2023-04-18 NOTE — Telephone Encounter (Addendum)
 Called patient back, there were no answer, no voice mail, to let him know that we couldn't get his paperwork filled out, that he had to get his Worker's Comp to fill that paperwork out for him. tvt

## 2023-04-18 NOTE — Telephone Encounter (Signed)
 LVM for patient to return call to the office, so that we can get a better understanding of what is being requested from [place]. Asked patient to call and ask to speak with providers assistant

## 2023-04-25 ENCOUNTER — Other Ambulatory Visit: Payer: Self-pay | Admitting: *Deleted

## 2023-04-25 DIAGNOSIS — I999 Unspecified disorder of circulatory system: Secondary | ICD-10-CM
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
## 2023-05-03 ENCOUNTER — Ambulatory Visit (INDEPENDENT_AMBULATORY_CARE_PROVIDER_SITE_OTHER): Admitting: Vascular Surgery

## 2023-05-03 ENCOUNTER — Ambulatory Visit (HOSPITAL_COMMUNITY)
Admission: RE | Admit: 2023-05-03 | Discharge: 2023-05-03 | Disposition: A | Discharge status: Home / Self Care | Source: Ambulatory Visit | Attending: Vascular Surgery | Admitting: Vascular Surgery

## 2023-05-03 ENCOUNTER — Encounter: Payer: Self-pay | Admitting: Vascular Surgery

## 2023-05-03 VITALS — BP 124/81 | HR 58 | Temp 98.3°F | Resp 18 | Ht 75.0 in | Wt 254.5 lb

## 2023-05-03 DIAGNOSIS — I999 Unspecified disorder of circulatory system: Secondary | ICD-10-CM | POA: Insufficient documentation

## 2023-05-03 DIAGNOSIS — I7142 Juxtarenal abdominal aortic aneurysm, without rupture: Secondary | ICD-10-CM | POA: Diagnosis not present

## 2023-05-03 LAB — VAS US ABI WITH/WO TBI
Left ABI: 1.26
Right ABI: 1.26

## 2023-05-09 ENCOUNTER — Ambulatory Visit (INDEPENDENT_AMBULATORY_CARE_PROVIDER_SITE_OTHER): Admitting: Psychiatry

## 2023-05-09 ENCOUNTER — Encounter (HOSPITAL_COMMUNITY): Payer: Self-pay | Admitting: Psychiatry

## 2023-05-09 VITALS — BP 131/77 | HR 76 | Ht 75.0 in | Wt 258.0 lb

## 2023-05-09 DIAGNOSIS — F1290 Cannabis use, unspecified, uncomplicated: Secondary | ICD-10-CM | POA: Diagnosis not present

## 2023-05-09 DIAGNOSIS — F4310 Post-traumatic stress disorder, unspecified: Secondary | ICD-10-CM | POA: Diagnosis not present

## 2023-05-09 DIAGNOSIS — F411 Generalized anxiety disorder: Secondary | ICD-10-CM

## 2023-05-09 MED ORDER — SERTRALINE HCL 50 MG PO TABS: 50.0000 mg | ORAL_TABLET | Freq: Every day | ORAL | 0 refills | Status: DC
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
## 2023-05-10 ENCOUNTER — Ambulatory Visit: Admitting: Orthopedic Surgery

## 2023-05-13 ENCOUNTER — Ambulatory Visit (INDEPENDENT_AMBULATORY_CARE_PROVIDER_SITE_OTHER): Payer: Worker's Compensation | Admitting: Orthopedic Surgery

## 2023-05-13 DIAGNOSIS — M62838 Other muscle spasm: Secondary | ICD-10-CM

## 2023-05-14 ENCOUNTER — Encounter: Payer: Self-pay | Admitting: Orthopedic Surgery
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
## 2023-05-15 ENCOUNTER — Ambulatory Visit (INDEPENDENT_AMBULATORY_CARE_PROVIDER_SITE_OTHER): Admitting: Licensed Clinical Social Worker

## 2023-05-15 DIAGNOSIS — Z62819 Personal history of unspecified abuse in childhood: Secondary | ICD-10-CM

## 2023-05-15 DIAGNOSIS — F1290 Cannabis use, unspecified, uncomplicated: Secondary | ICD-10-CM

## 2023-05-15 DIAGNOSIS — F411 Generalized anxiety disorder: Secondary | ICD-10-CM
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
## 2023-06-05 ENCOUNTER — Ambulatory Visit (HOSPITAL_COMMUNITY): Admitting: Licensed Clinical Social Worker

## 2023-06-11 ENCOUNTER — Ambulatory Visit (HOSPITAL_COMMUNITY): Admitting: Psychiatry

## 2023-06-19 ENCOUNTER — Ambulatory Visit (HOSPITAL_COMMUNITY): Payer: Self-pay | Admitting: Licensed Clinical Social Worker

## 2023-06-26 ENCOUNTER — Ambulatory Visit (HOSPITAL_COMMUNITY): Admitting: Licensed Clinical Social Worker

## 2023-06-26 ENCOUNTER — Encounter (HOSPITAL_COMMUNITY): Payer: Self-pay | Admitting: Licensed Clinical Social Worker

## 2023-07-03 ENCOUNTER — Ambulatory Visit (HOSPITAL_COMMUNITY): Admitting: Licensed Clinical Social Worker

## 2023-07-10 ENCOUNTER — Ambulatory Visit (HOSPITAL_COMMUNITY): Admitting: Licensed Clinical Social Worker

## 2023-08-14 ENCOUNTER — Ambulatory Visit: Payer: Commercial Managed Care - PPO | Admitting: Podiatry

## 2023-09-20 ENCOUNTER — Other Ambulatory Visit (HOSPITAL_BASED_OUTPATIENT_CLINIC_OR_DEPARTMENT_OTHER): Payer: Self-pay

## 2023-09-20 ENCOUNTER — Encounter (HOSPITAL_BASED_OUTPATIENT_CLINIC_OR_DEPARTMENT_OTHER): Payer: Self-pay | Admitting: Urology

## 2023-09-20 ENCOUNTER — Emergency Department (HOSPITAL_BASED_OUTPATIENT_CLINIC_OR_DEPARTMENT_OTHER)

## 2023-09-20 ENCOUNTER — Other Ambulatory Visit: Payer: Self-pay

## 2023-09-20 ENCOUNTER — Emergency Department (HOSPITAL_BASED_OUTPATIENT_CLINIC_OR_DEPARTMENT_OTHER)
Admission: EM | Admit: 2023-09-20 | Discharge: 2023-09-20 | Disposition: A | Discharge status: Home / Self Care | Attending: Emergency Medicine | Admitting: Emergency Medicine

## 2023-09-20 DIAGNOSIS — R21 Rash and other nonspecific skin eruption: Secondary | ICD-10-CM | POA: Diagnosis not present

## 2023-09-20 DIAGNOSIS — K859 Acute pancreatitis without necrosis or infection, unspecified: Secondary | ICD-10-CM | POA: Diagnosis not present

## 2023-09-20 DIAGNOSIS — R109 Unspecified abdominal pain: Secondary | ICD-10-CM

## 2023-09-20 DIAGNOSIS — R1032 Left lower quadrant pain: Secondary | ICD-10-CM | POA: Diagnosis present

## 2023-09-20 LAB — HEPATIC FUNCTION PANEL
ALT: 27 U/L (ref 0–44)
AST: 29 U/L (ref 15–41)
Albumin: 4.3 g/dL (ref 3.5–5.0)
Alkaline Phosphatase: 95 U/L (ref 38–126)
Bilirubin, Direct: 0.1 mg/dL (ref 0.0–0.2)
Indirect Bilirubin: 0.3 mg/dL (ref 0.3–0.9)
Total Bilirubin: 0.4 mg/dL (ref 0.0–1.2)
Total Protein: 7 g/dL (ref 6.5–8.1)

## 2023-09-20 LAB — CBC
HCT: 42 % (ref 39.0–52.0)
Hemoglobin: 13.5 g/dL (ref 13.0–17.0)
MCH: 30.3 pg (ref 26.0–34.0)
MCHC: 32.1 g/dL (ref 30.0–36.0)
MCV: 94.4 fL (ref 80.0–100.0)
Platelets: 227 K/uL (ref 150–400)
RBC: 4.45 MIL/uL (ref 4.22–5.81)
RDW: 13.5 % (ref 11.5–15.5)
WBC: 4.3 K/uL (ref 4.0–10.5)
nRBC: 0 % (ref 0.0–0.2)

## 2023-09-20 LAB — BASIC METABOLIC PANEL WITH GFR
Anion gap: 11 (ref 5–15)
BUN: 9 mg/dL (ref 6–20)
CO2: 25 mmol/L (ref 22–32)
Calcium: 9.1 mg/dL (ref 8.9–10.3)
Chloride: 105 mmol/L (ref 98–111)
Creatinine, Ser: 0.91 mg/dL (ref 0.61–1.24)
GFR, Estimated: >60 mL/min (ref >60)
Glucose, Bld: 111 mg/dL — ABNORMAL HIGH (ref 70–99)
Potassium: 4.2 mmol/L (ref 3.5–5.1)
Sodium: 140 mmol/L (ref 135–145)

## 2023-09-20 LAB — URINALYSIS, ROUTINE W REFLEX MICROSCOPIC
Bilirubin Urine: NEGATIVE
Glucose, UA: NEGATIVE mg/dL
Hgb urine dipstick: NEGATIVE
Ketones, ur: NEGATIVE mg/dL
Leukocytes,Ua: NEGATIVE
Nitrite: NEGATIVE
Protein, ur: NEGATIVE mg/dL
Specific Gravity, Urine: 1.025 (ref 1.005–1.030)
pH: 7 (ref 5.0–8.0)

## 2023-09-20 LAB — LIPASE, BLOOD: Lipase: 71 U/L — ABNORMAL HIGH (ref 11–51)

## 2023-09-20 MED ORDER — ONDANSETRON 4 MG PO TBDP
4.0000 mg | ORAL_TABLET | Freq: Three times a day (TID) | ORAL | 0 refills | Status: DC | PRN
  Filled 2023-09-20: qty 20, 7d supply, fill #0

## 2023-09-20 MED ORDER — IOHEXOL 300 MG/ML  SOLN
100.0000 mL | Freq: Once | INTRAMUSCULAR | Status: AC | PRN
Start: 2023-09-20 — End: 2023-09-20
  Administered 2023-09-20: 100 mL via INTRAVENOUS

## 2023-09-20 MED ORDER — VALACYCLOVIR HCL 1 G PO TABS
1000.0000 mg | ORAL_TABLET | Freq: Three times a day (TID) | ORAL | 0 refills | Status: DC
  Filled 2023-09-20: qty 21, 7d supply, fill #0

## 2023-09-20 MED ORDER — OXYCODONE-ACETAMINOPHEN 5-325 MG PO TABS
1.0000 | ORAL_TABLET | ORAL | 0 refills | Status: DC | PRN
  Filled 2023-09-20: qty 15, 3d supply, fill #0
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
## 2023-09-20 NOTE — ED Notes (Signed)
 Attempt at IV x 2 with no result, unable to thread.  Was able to obtain labs

## 2023-09-20 NOTE — ED Triage Notes (Signed)
 Pt states past 3 days has had left side flank pain intermittently  Radiating to right lower pelvis  No urinary symptoms per pt

## 2023-09-20 NOTE — ED Notes (Addendum)
 Patient transported to CT

## 2023-09-20 NOTE — Discharge Instructions (Signed)
 Your history, exam and workup today seem consistent either with shingles given the small rash on your left side versus pancreatitis given your labs and imaging.  We do not see evidence of an acute surgical problem at this time and given your well appearance we feel you are safe for discharge home.  Please consider using the pain and nausea medicine to help with symptoms and maintain hydration.  Please drink lots of fluids.  If you develop more rash on the left abdomen in a bandlike pattern, please start taking the shingles medication the valacyclovir .  Please follow-up with your primary doctor and rest.  If any symptoms change or worsen acutely, please return to the nearest emergency department.

## 2023-11-18 ENCOUNTER — Encounter: Payer: Self-pay | Admitting: Radiology

## 2024-01-23 ENCOUNTER — Ambulatory Visit: Admitting: Family Medicine

## 2024-01-23 ENCOUNTER — Encounter: Payer: Self-pay | Admitting: Family Medicine

## 2024-01-23 ENCOUNTER — Ambulatory Visit: Payer: Self-pay

## 2024-01-23 VITALS — BP 130/71 | HR 78 | Ht 75.0 in | Wt 270.0 lb

## 2024-01-23 DIAGNOSIS — G8929 Other chronic pain: Secondary | ICD-10-CM

## 2024-01-23 DIAGNOSIS — M25512 Pain in left shoulder: Secondary | ICD-10-CM | POA: Diagnosis not present

## 2024-01-23 MED ORDER — BACLOFEN 10 MG PO TABS: 10.0000 mg | ORAL_TABLET | Freq: Three times a day (TID) | ORAL | 0 refills | Status: AC | PRN

## 2024-01-23 MED ORDER — NAPROXEN 500 MG PO TABS: 500.0000 mg | ORAL_TABLET | Freq: Two times a day (BID) | ORAL | 0 refills | Status: AC | PRN
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
## 2024-02-14 ENCOUNTER — Ambulatory Visit: Payer: Self-pay

## 2024-02-14 NOTE — Telephone Encounter (Signed)
 FYI - Per E2C2 Triage RN - the patient was directed to go to the ED due to worsening shoulder pain, SOB, and excessive sweating.   Has had left shoulder pain for a month. He has tried using ice, using heat, Baclofen  and Naproxen . Attempted to contact the patient, no answer. Left a brief vm msg to return a call back to the clinic.

## 2024-02-14 NOTE — Telephone Encounter (Signed)
 FYI Only or Action Required?: FYI only for provider: ED advised.  Patient was last seen in primary care on 01/23/2024 by Colette Torrence GRADE, MD.  Called Nurse Triage reporting Shoulder Pain.  Symptoms began about a month ago but worse lately.  Interventions attempted: Prescription medications: Baclofen , Naproxen  and Ice/heat application.  Symptoms are: gradually worsening.  Triage Disposition: Go to ED Now (Notify PCP)  Patient/caregiver understands and will follow disposition?: Yes      Message from [facility] sent at 02/14/2024  8:02 AM EST  Reason for Triage: Worsening shoulder pain and excessive sweating.   Reason for Disposition  Difficulty breathing or unusual sweating (e.g., sweating without exertion)  Answer Assessment - Initial Assessment Questions Patient's significant other called for the patient as patient was getting ready for work  She advised that the patient has had left shoulder pain for a month  He had tried using ice, using heat, and has been prescribed Baclofen  and Naproxen   She states that he also has profuse sweating this morning She denies knowing of him having any cardiac or pulmonary history  No strenuous extra use of arms Significant other states she has a lot of bowel movements but patient states that is normal.   Significant other states that he has had excessive sweating for about two months but today is the first time she noticed he got out of the shower and immediately had profuse sweating as he was getting ready to leave for work. She states she herself advised him that this wasn't normal so she decided to call his PCP office. She was advised that with his symptoms it is recommended that he goes to the Emergency Room for further evaluation at this time. She advised that he has left for work. She attempted to call him on three way and our call disconnected This RN did not speak with the patient. This RN attempted to call the patient and  Call Cannot Be Completed was the message during that attempt This RN called Dominic back and she advised that the patient's phone was off at this time and she had to contact him through  She states that he is going to pick up a client but he advised that he would then go to the ER. Her call was made through an app and this RN cannot contact the patient in this manner. She was advised that this RN would advise his PCP about his symptoms and the recommendation. She verbalized understanding.  The caller, Dominic, is advised to call us  back with any further questions/concerns. She is also advised that a hospital follow up would be recommended afterwards and for them to give us  a call back after the patient is seen at the ER She is also advised that if anything worsens to call 911. She verbalized understanding.  Protocols used: Shoulder Pain-A-AH

## 2024-03-25 ENCOUNTER — Encounter: Admitting: Family Medicine
